# Patient Record
Sex: Male | Born: 1965 | Race: White | Hispanic: No | Smoking: Never smoker
Health system: Southern US, Community
[De-identification: ages and names within clinical notes are randomized; demographics above are authoritative.]

## PROBLEM LIST (undated history)

## (undated) DIAGNOSIS — K219 Gastro-esophageal reflux disease without esophagitis: Secondary | ICD-10-CM

## (undated) DIAGNOSIS — F419 Anxiety disorder, unspecified: Secondary | ICD-10-CM

## (undated) DIAGNOSIS — I10 Essential (primary) hypertension: Secondary | ICD-10-CM

---

## 2005-08-28 ENCOUNTER — Other Ambulatory Visit: Payer: Self-pay

## 2005-08-28 ENCOUNTER — Emergency Department: Payer: Self-pay | Admitting: Emergency Medicine

## 2005-09-08 ENCOUNTER — Ambulatory Visit: Payer: Self-pay | Admitting: Gastroenterology

## 2008-08-22 ENCOUNTER — Ambulatory Visit: Payer: Self-pay | Admitting: Family Medicine

## 2008-08-22 ENCOUNTER — Emergency Department: Payer: Self-pay | Admitting: Surgery

## 2009-06-21 ENCOUNTER — Emergency Department: Payer: Self-pay | Admitting: Internal Medicine

## 2010-05-17 ENCOUNTER — Emergency Department: Payer: Self-pay | Admitting: Internal Medicine

## 2010-07-07 ENCOUNTER — Ambulatory Visit: Payer: Self-pay | Admitting: Gastroenterology

## 2010-07-09 LAB — PATHOLOGY REPORT

## 2010-09-25 ENCOUNTER — Ambulatory Visit: Payer: Self-pay | Admitting: Gastroenterology

## 2010-09-28 ENCOUNTER — Emergency Department: Payer: Self-pay

## 2011-01-08 ENCOUNTER — Emergency Department: Payer: Self-pay | Admitting: Emergency Medicine

## 2011-03-26 ENCOUNTER — Emergency Department: Payer: Self-pay | Admitting: *Deleted

## 2011-04-15 ENCOUNTER — Ambulatory Visit: Payer: Self-pay | Admitting: Pain Medicine

## 2011-04-23 ENCOUNTER — Emergency Department: Payer: Self-pay | Admitting: *Deleted

## 2011-05-05 ENCOUNTER — Ambulatory Visit: Payer: Self-pay | Admitting: Pain Medicine

## 2011-05-24 ENCOUNTER — Ambulatory Visit: Payer: Self-pay | Admitting: Pain Medicine

## 2011-05-27 ENCOUNTER — Ambulatory Visit: Payer: Self-pay | Admitting: Pain Medicine

## 2011-06-01 ENCOUNTER — Ambulatory Visit: Payer: Self-pay | Admitting: Pain Medicine

## 2011-06-07 ENCOUNTER — Ambulatory Visit: Payer: Self-pay | Admitting: Pain Medicine

## 2011-06-17 ENCOUNTER — Ambulatory Visit: Payer: Self-pay | Admitting: Pain Medicine

## 2011-09-18 ENCOUNTER — Ambulatory Visit: Payer: Self-pay | Admitting: Medical

## 2011-09-18 LAB — COMPREHENSIVE METABOLIC PANEL
Bilirubin,Total: 2 mg/dL — ABNORMAL HIGH (ref 0.2–1.0)
Calcium, Total: 9.8 mg/dL (ref 8.5–10.1)
Creatinine: 1.24 mg/dL (ref 0.60–1.30)
EGFR (African American): >60
Glucose: 105 mg/dL — ABNORMAL HIGH (ref 65–99)
Osmolality: 277 (ref 275–301)
SGOT(AST): 28 U/L (ref 15–37)
SGPT (ALT): 40 U/L

## 2011-09-18 LAB — CK: CK, Total: 221 U/L (ref 35–232)

## 2011-09-18 LAB — CBC WITH DIFFERENTIAL/PLATELET
Basophil #: 0 10*3/uL (ref 0.0–0.1)
Eosinophil #: 0.2 10*3/uL (ref 0.0–0.7)
HCT: 44.4 % (ref 40.0–52.0)
Lymphocyte #: 1.7 10*3/uL (ref 1.0–3.6)
MCV: 95 fL (ref 80–100)
Neutrophil #: 4.5 10*3/uL (ref 1.4–6.5)
RDW: 12.6 % (ref 11.5–14.5)
WBC: 7 10*3/uL (ref 3.8–10.6)

## 2011-10-18 ENCOUNTER — Ambulatory Visit: Payer: Self-pay

## 2011-11-08 ENCOUNTER — Emergency Department: Payer: Self-pay | Admitting: Emergency Medicine

## 2011-11-08 LAB — URINALYSIS, COMPLETE
Bacteria: NONE SEEN
Blood: NEGATIVE
Glucose,UR: NEGATIVE mg/dL (ref 0–75)
Leukocyte Esterase: NEGATIVE
Nitrite: NEGATIVE
Ph: 6 (ref 4.5–8.0)
Protein: NEGATIVE
RBC,UR: <1 /HPF (ref 0–5)
Specific Gravity: 1.015 (ref 1.003–1.030)

## 2011-11-08 LAB — CBC
HCT: 42.4 % (ref 40.0–52.0)
MCH: 32.3 pg (ref 26.0–34.0)
MCHC: 34.6 g/dL (ref 32.0–36.0)
RBC: 4.53 10*6/uL (ref 4.40–5.90)
RDW: 12.4 % (ref 11.5–14.5)
WBC: 8.4 10*3/uL (ref 3.8–10.6)

## 2011-11-08 LAB — COMPREHENSIVE METABOLIC PANEL
Anion Gap: 8 (ref 7–16)
BUN: 16 mg/dL (ref 7–18)
Calcium, Total: 9.4 mg/dL (ref 8.5–10.1)
Chloride: 101 mmol/L (ref 98–107)
EGFR (African American): >60
EGFR (Non-African Amer.): >60
Osmolality: 278 (ref 275–301)
Potassium: 3.7 mmol/L (ref 3.5–5.1)
SGOT(AST): 27 U/L (ref 15–37)
SGPT (ALT): 32 U/L
Sodium: 138 mmol/L (ref 136–145)
Total Protein: 7.8 g/dL (ref 6.4–8.2)

## 2011-11-16 ENCOUNTER — Emergency Department: Payer: Self-pay | Admitting: Emergency Medicine

## 2011-11-24 ENCOUNTER — Ambulatory Visit: Payer: Self-pay | Admitting: Gastroenterology

## 2011-11-29 ENCOUNTER — Ambulatory Visit: Payer: Self-pay | Admitting: Gastroenterology

## 2011-12-02 ENCOUNTER — Ambulatory Visit: Payer: Self-pay | Admitting: Medical

## 2011-12-06 ENCOUNTER — Emergency Department: Payer: Self-pay | Admitting: Emergency Medicine

## 2011-12-06 LAB — CBC
HCT: 42.8 %
HGB: 15.3 g/dL
MCH: 33 pg
MCHC: 35.7 g/dL
MCV: 92 fL
Platelet: 350 x10 3/mm 3
RBC: 4.63 x10 6/mm 3
RDW: 12.5 %
WBC: 8.2 x10 3/mm 3

## 2011-12-06 LAB — URINALYSIS, COMPLETE
Blood: NEGATIVE
Glucose,UR: NEGATIVE mg/dL (ref 0–75)
Ketone: NEGATIVE
Leukocyte Esterase: NEGATIVE
Nitrite: NEGATIVE
Ph: 7 (ref 4.5–8.0)
Protein: NEGATIVE
RBC,UR: NONE SEEN /HPF (ref 0–5)
Specific Gravity: 1.006 (ref 1.003–1.030)
Squamous Epithelial: NONE SEEN
WBC UR: <1 /HPF (ref 0–5)

## 2011-12-06 LAB — COMPREHENSIVE METABOLIC PANEL WITH GFR
Albumin: 4.3 g/dL
Alkaline Phosphatase: 77 U/L
Anion Gap: 8
BUN: 16 mg/dL
Bilirubin,Total: 1.1 mg/dL — ABNORMAL HIGH
Calcium, Total: 9.7 mg/dL
Chloride: 100 mmol/L
Co2: 29 mmol/L
Creatinine: 1.24 mg/dL
EGFR (African American): >60
EGFR (Non-African Amer.): >60
Glucose: 97 mg/dL
Osmolality: 275
Potassium: 3.8 mmol/L
SGOT(AST): 21 U/L
SGPT (ALT): 25 U/L
Sodium: 137 mmol/L
Total Protein: 8.1 g/dL

## 2011-12-06 LAB — LIPASE, BLOOD: Lipase: 247 U/L

## 2012-01-04 ENCOUNTER — Ambulatory Visit: Payer: Self-pay | Admitting: Gastroenterology

## 2012-01-10 LAB — PATHOLOGY REPORT

## 2012-03-29 ENCOUNTER — Other Ambulatory Visit: Payer: Self-pay | Admitting: Gastroenterology

## 2012-03-29 LAB — CLOSTRIDIUM DIFFICILE BY PCR

## 2012-04-01 LAB — STOOL CULTURE

## 2012-05-03 ENCOUNTER — Ambulatory Visit: Payer: Self-pay | Admitting: Gastroenterology

## 2012-05-08 ENCOUNTER — Ambulatory Visit: Payer: Self-pay | Admitting: Gastroenterology

## 2012-05-08 LAB — TSH: Thyroid Stimulating Horm: 1.03 u[IU]/mL

## 2012-05-10 ENCOUNTER — Ambulatory Visit: Payer: Self-pay | Admitting: Family Medicine

## 2012-11-19 ENCOUNTER — Ambulatory Visit: Payer: Self-pay | Admitting: Internal Medicine

## 2012-11-19 LAB — CBC WITH DIFFERENTIAL/PLATELET
Basophil #: 0 10*3/uL (ref 0.0–0.1)
Basophil %: 0.5 %
Eosinophil #: 0.4 10*3/uL (ref 0.0–0.7)
Lymphocyte #: 2 10*3/uL (ref 1.0–3.6)
MCH: 33.1 pg (ref 26.0–34.0)
Monocyte #: 0.8 x10 3/mm (ref 0.2–1.0)
Monocyte %: 9.6 %
Neutrophil #: 4.9 10*3/uL (ref 1.4–6.5)
RBC: 4.89 10*6/uL (ref 4.40–5.90)
RDW: 13.4 % (ref 11.5–14.5)

## 2012-11-19 LAB — CK: CK, Total: 459 U/L — ABNORMAL HIGH (ref 35–232)

## 2012-11-19 LAB — COMPREHENSIVE METABOLIC PANEL
Albumin: 3.8 g/dL (ref 3.4–5.0)
Alkaline Phosphatase: 81 U/L (ref 50–136)
BUN: 14 mg/dL (ref 7–18)
Bilirubin,Total: 1 mg/dL (ref 0.2–1.0)
Calcium, Total: 9.1 mg/dL (ref 8.5–10.1)
Creatinine: 1.33 mg/dL — ABNORMAL HIGH (ref 0.60–1.30)
EGFR (African American): >60
EGFR (Non-African Amer.): >60
Glucose: 110 mg/dL — ABNORMAL HIGH (ref 65–99)
Sodium: 140 mmol/L (ref 136–145)
Total Protein: 7.9 g/dL (ref 6.4–8.2)

## 2012-11-19 LAB — TROPONIN I: Troponin-I: <0.02 ng/mL

## 2012-11-19 LAB — CK-MB: CK-MB: 2.1 ng/mL (ref 0.5–3.6)

## 2013-04-21 ENCOUNTER — Ambulatory Visit: Payer: Self-pay | Admitting: Family Medicine

## 2013-04-21 ENCOUNTER — Ambulatory Visit: Payer: Self-pay | Admitting: Physician Assistant

## 2013-05-13 ENCOUNTER — Ambulatory Visit: Payer: Self-pay | Admitting: Family Medicine

## 2013-07-10 ENCOUNTER — Ambulatory Visit: Payer: Self-pay | Admitting: Gastroenterology

## 2013-07-12 LAB — PATHOLOGY REPORT

## 2013-08-02 IMAGING — CR DG THORACIC SPINE 2-3V
1 series · 5 of 5 positions shown · non-contrast
Comparison: none

REASON FOR EXAM: xyphoid/chest pain/ non cardiac sterunum and
COMMENTS:

[Series 1: t thoracic spine ap · 0.14mm/px · 5 of 5 slices shown]
[im 1/5]
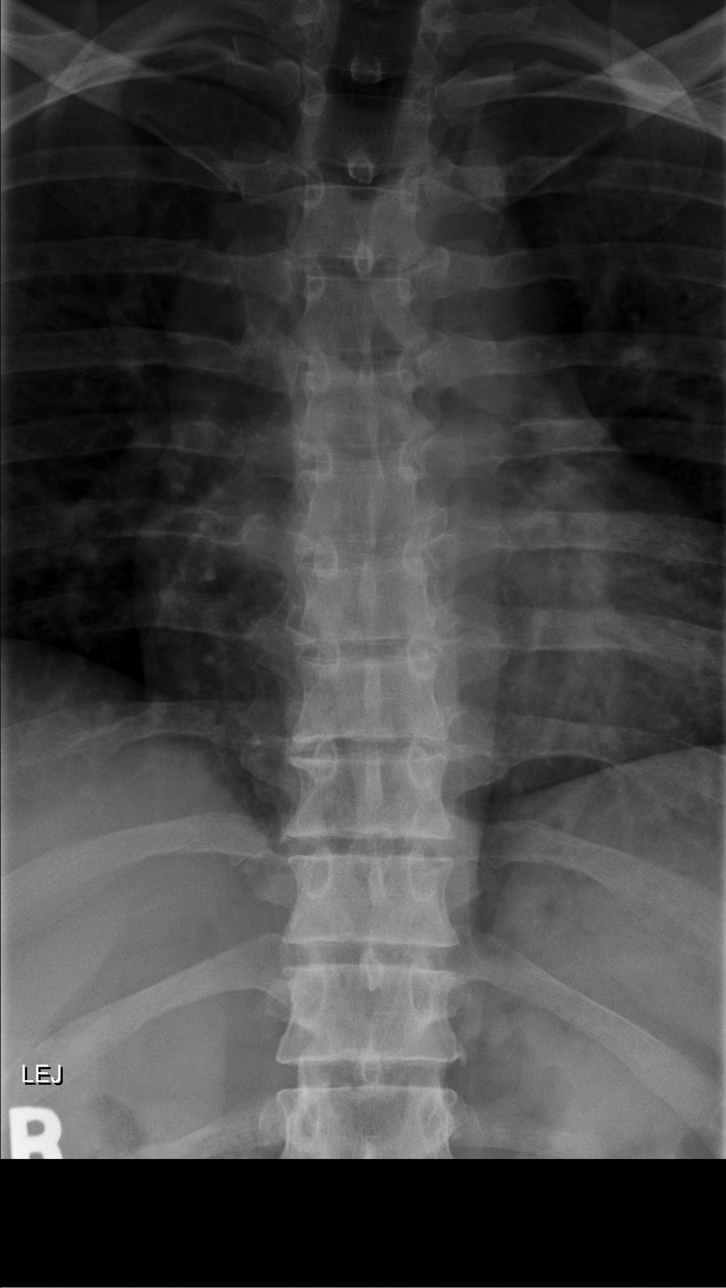
[im 2/5]
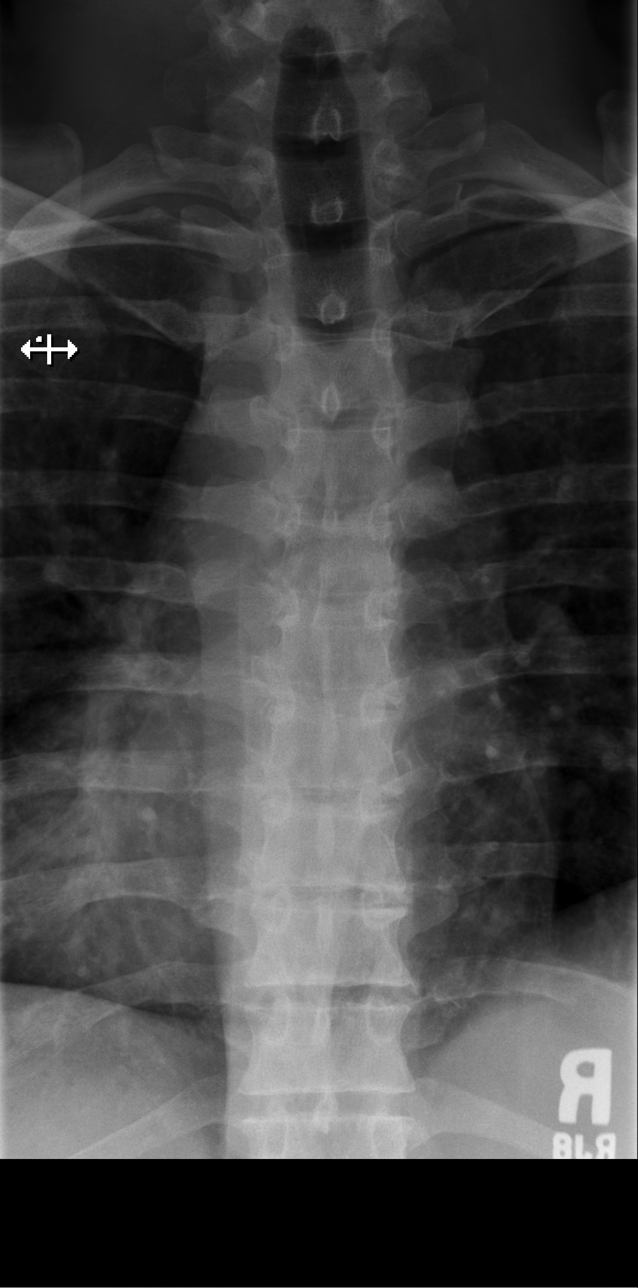
[im 3/5]
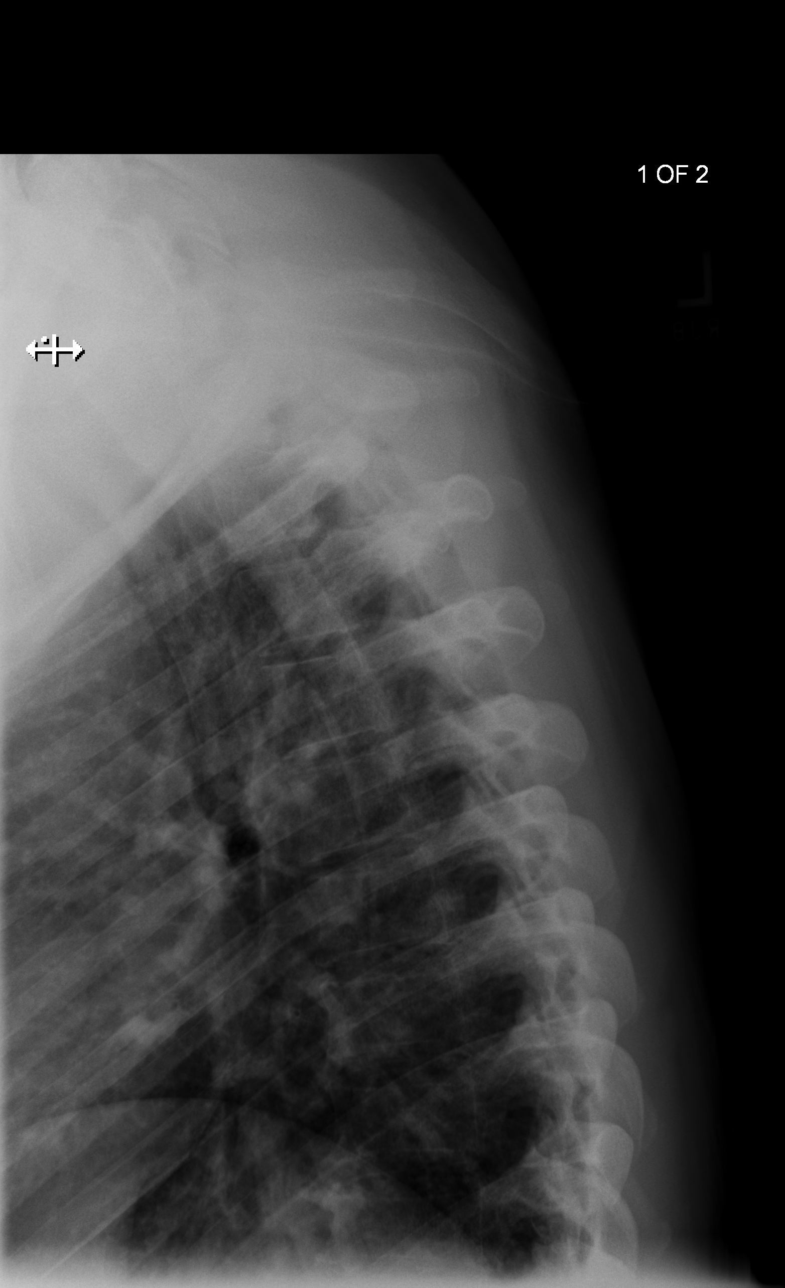
[im 4/5]
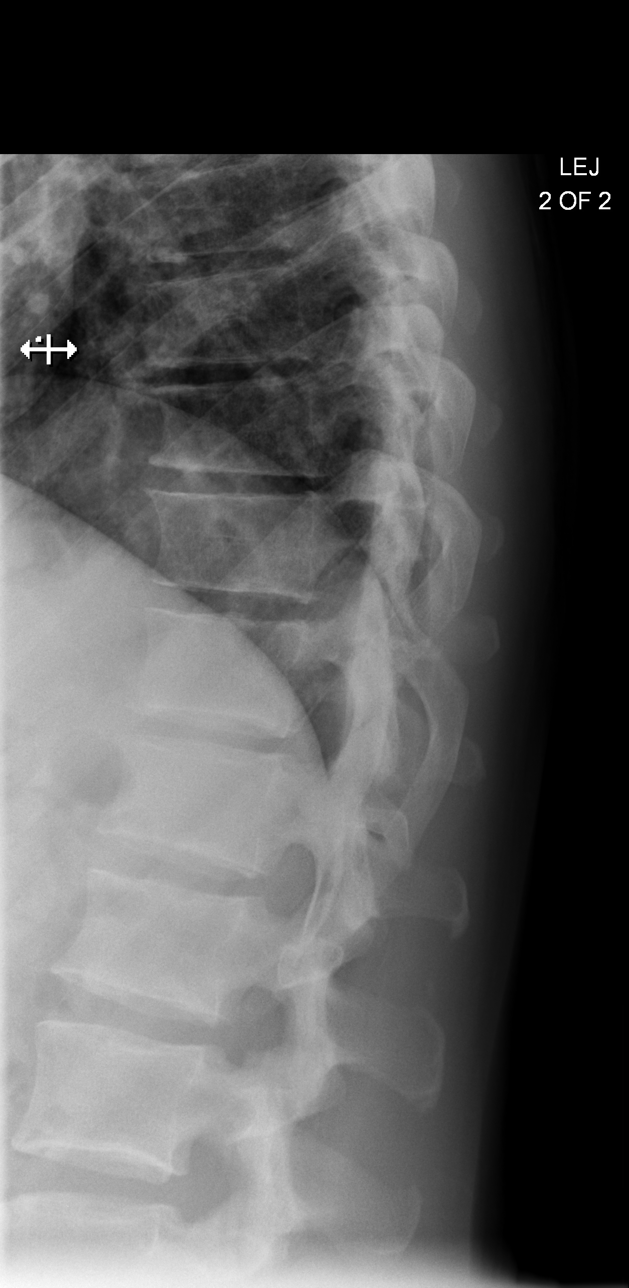
[im 5/5]
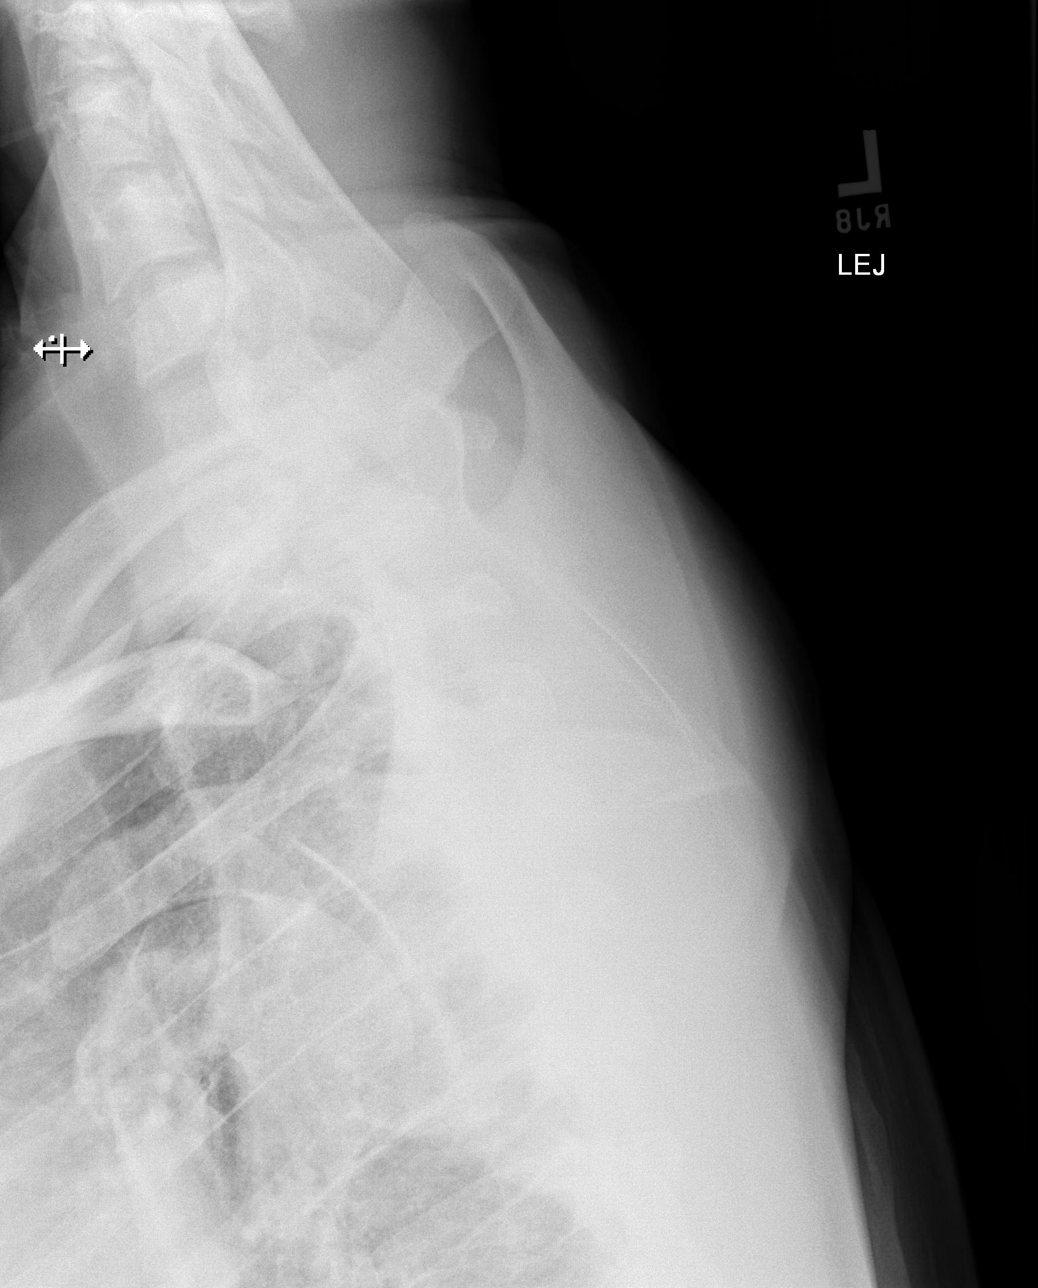

[5 of 5 positions shown; findings below may reference images not displayed]

PROCEDURE:     DXR - DXR THORACIC  AP AND LATERAL  - May 24, 2011 [DATE]

RESULT:     AP and lateral projections of the thoracic spine including an
attempted swimmer's view show grossly normal spinal alignment with
preservation of the vertebral body heights and intervertebral disc spaces.
Minimal degenerative disc disease changes and disc space narrowing are noted.
IMPRESSION: No acute bony abnormality evident. MRI is available for further assessment
if there is concern for an acute compression fracture.

## 2013-08-12 ENCOUNTER — Ambulatory Visit: Payer: Self-pay | Admitting: Emergency Medicine

## 2013-11-21 ENCOUNTER — Ambulatory Visit: Payer: Self-pay

## 2013-11-21 LAB — CBC WITH DIFFERENTIAL/PLATELET
Basophil #: 0.1 10*3/uL (ref 0.0–0.1)
Basophil %: 0.6 %
Eosinophil #: 0.3 10*3/uL (ref 0.0–0.7)
Eosinophil %: 3.1 %
HCT: 42.8 % (ref 40.0–52.0)
HGB: 14.9 g/dL (ref 13.0–18.0)
LYMPHS ABS: 2.5 10*3/uL (ref 1.0–3.6)
Lymphocyte %: 30.1 %
MCH: 34.2 pg — ABNORMAL HIGH (ref 26.0–34.0)
MCHC: 34.9 g/dL (ref 32.0–36.0)
MCV: 98 fL (ref 80–100)
MONOS PCT: 10.1 %
Monocyte #: 0.8 x10 3/mm (ref 0.2–1.0)
NEUTROS ABS: 4.7 10*3/uL (ref 1.4–6.5)
NEUTROS PCT: 56.1 %
Platelet: 345 10*3/uL (ref 150–440)
RBC: 4.36 10*6/uL — ABNORMAL LOW (ref 4.40–5.90)
RDW: 12.8 % (ref 11.5–14.5)
WBC: 8.4 10*3/uL (ref 3.8–10.6)

## 2013-11-21 LAB — COMPREHENSIVE METABOLIC PANEL
ALBUMIN: 3.9 g/dL (ref 3.4–5.0)
ALK PHOS: 87 U/L
Anion Gap: 9 (ref 7–16)
BUN: 16 mg/dL (ref 7–18)
Bilirubin,Total: 1 mg/dL (ref 0.2–1.0)
CALCIUM: 9.2 mg/dL (ref 8.5–10.1)
CHLORIDE: 100 mmol/L (ref 98–107)
CO2: 28 mmol/L (ref 21–32)
CREATININE: 1.13 mg/dL (ref 0.60–1.30)
EGFR (African American): >60
EGFR (Non-African Amer.): >60
Glucose: 117 mg/dL — ABNORMAL HIGH (ref 65–99)
OSMOLALITY: 276 (ref 275–301)
POTASSIUM: 4.1 mmol/L (ref 3.5–5.1)
SGOT(AST): 49 U/L — ABNORMAL HIGH (ref 15–37)
SGPT (ALT): 77 U/L — ABNORMAL HIGH
SODIUM: 137 mmol/L (ref 136–145)
Total Protein: 8.1 g/dL (ref 6.4–8.2)

## 2013-11-21 LAB — D-DIMER(ARMC): D-Dimer: 372 ng/ml

## 2013-11-21 LAB — CK TOTAL AND CKMB (NOT AT ARMC)
CK, Total: 618 U/L — ABNORMAL HIGH
CK-MB: 2.8 ng/mL (ref 0.5–3.6)

## 2013-12-27 IMAGING — CR DG RIBS 2V*L*
1 series · 3 of 3 positions shown · non-contrast
Comparison: none

REASON FOR EXAM: L lower rib pain
COMMENTS:

PROCEDURE:     MDR - MDR RIBS LEFT UNILATERAL  - October 18, 2011 [DATE]
RESULT:     Nondisplaced fracture of the posterior mid left rib cannot be
excluded. No pneumothorax. No other focal bony abnormalities identified.

[Series 1: ap · 0.17mm/px · 3 of 3 slices shown]
[im 1/3]
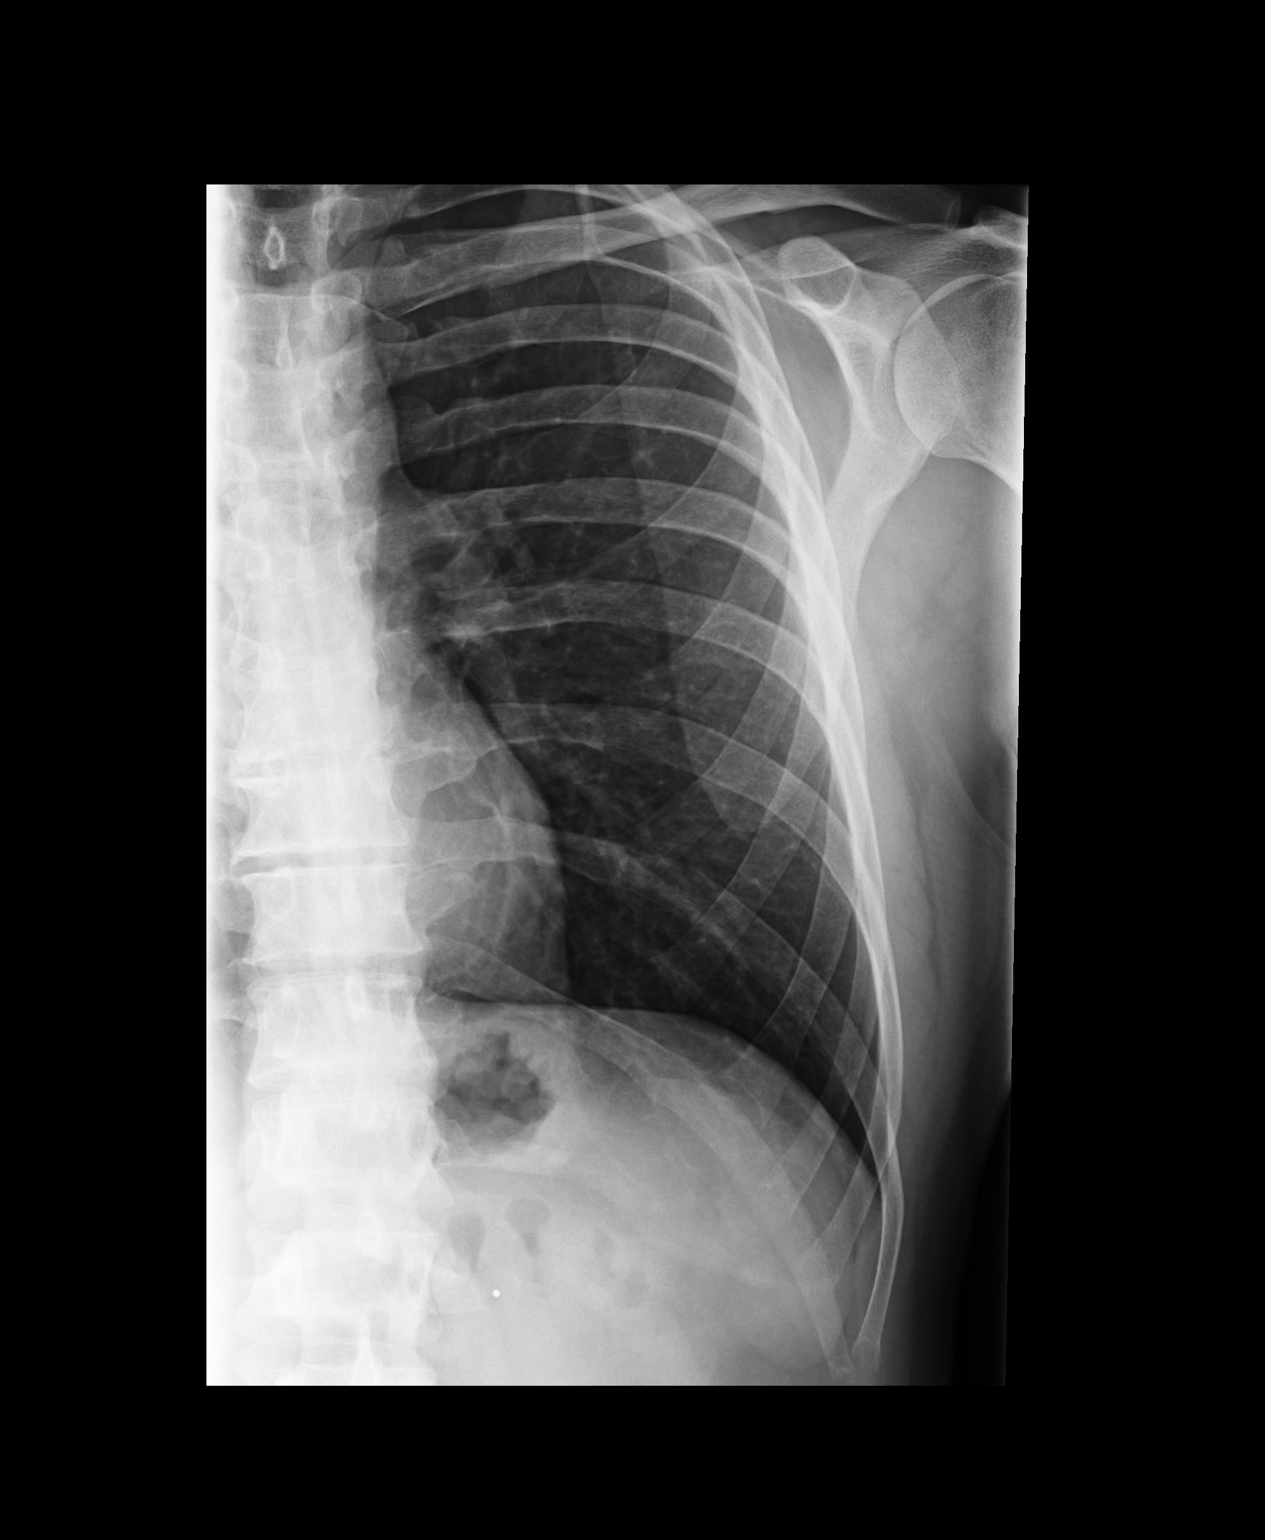
[im 2/3]
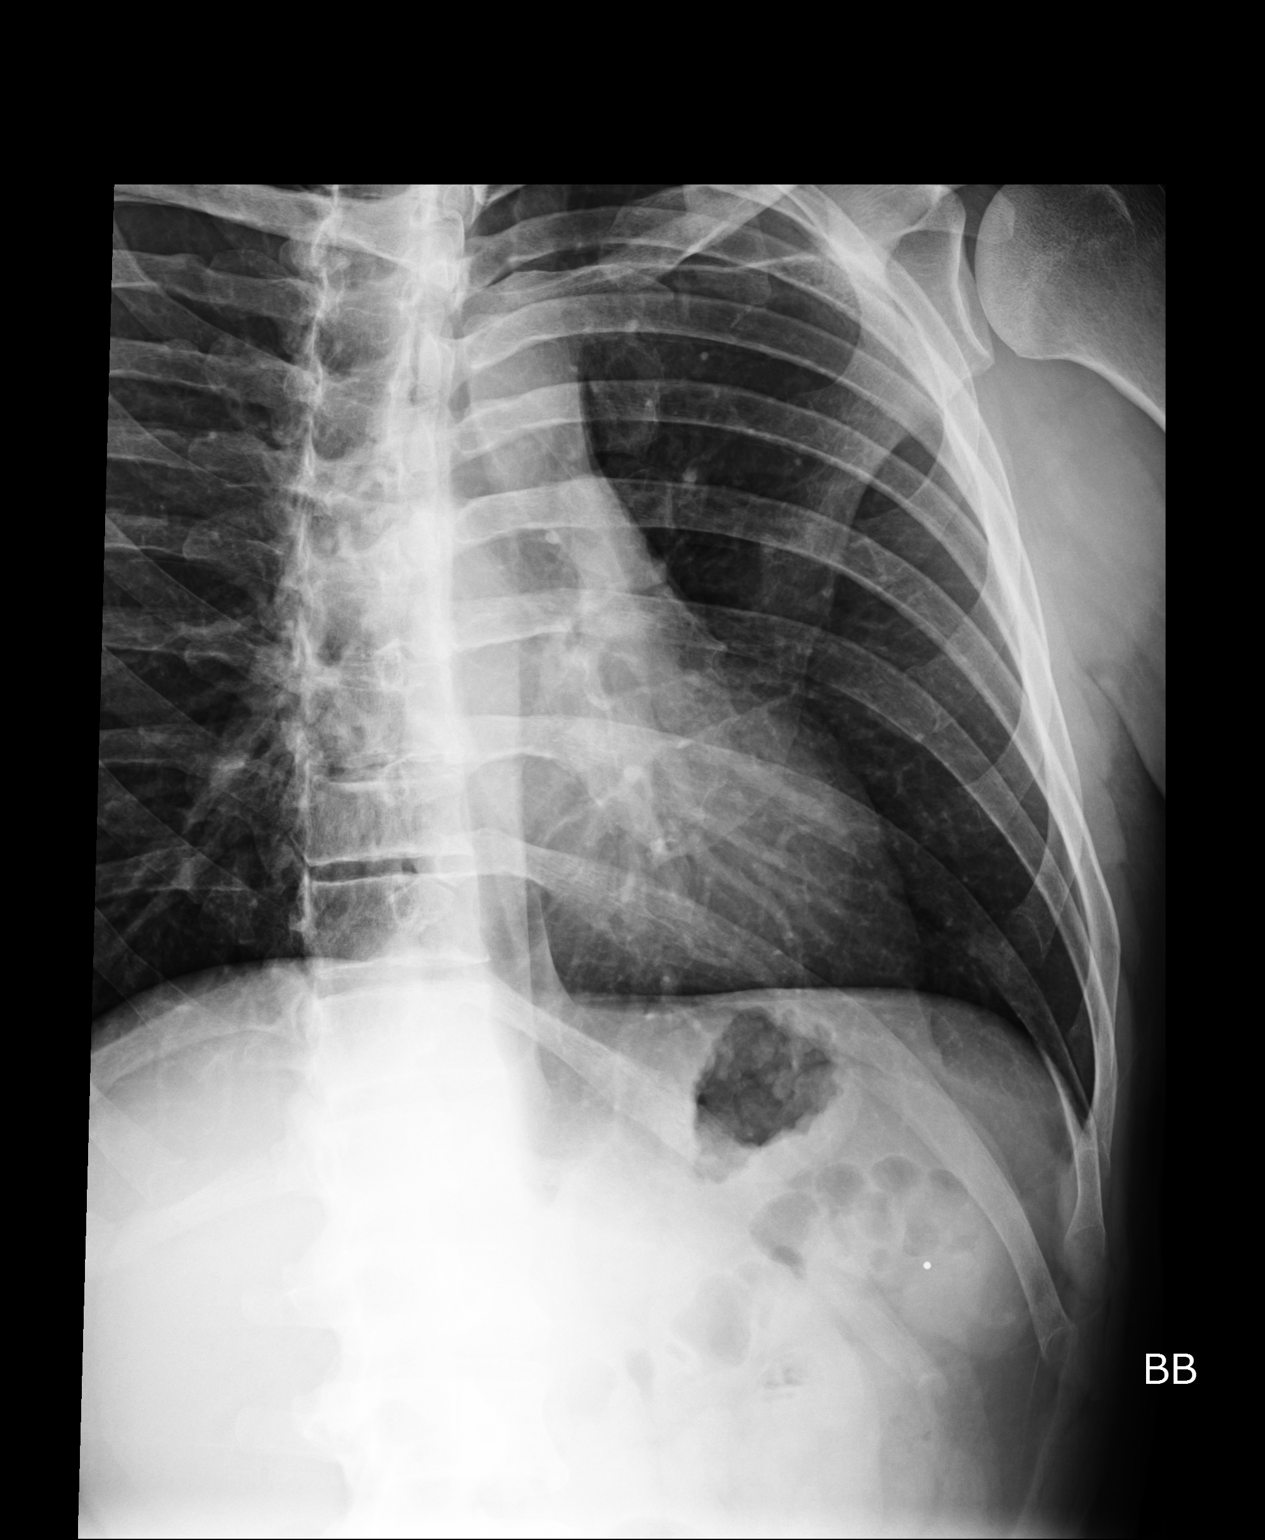
[im 3/3]
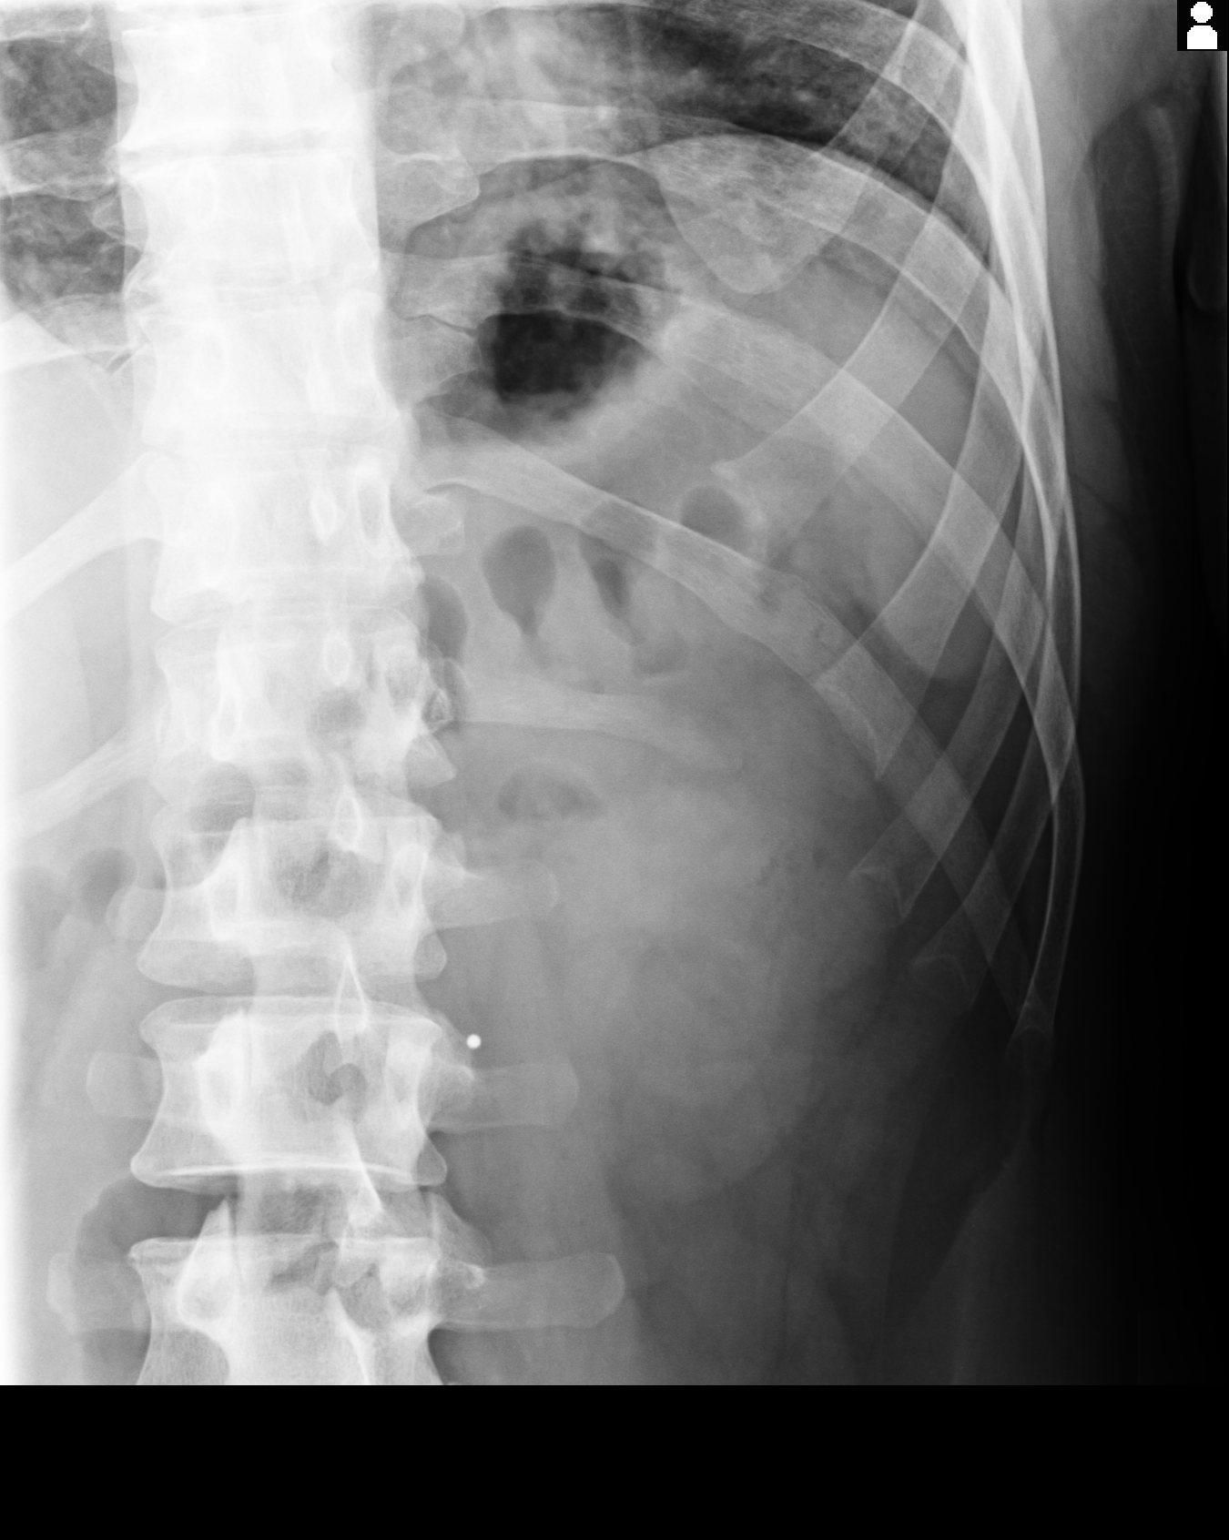

[3 of 3 positions shown; findings below may reference images not displayed]

IMPRESSION: Cannot exclude nondisplaced fracture of the midportion of
the posterior aspect of the left eleventh rib. No evidence of pneumothorax.

## 2013-12-27 IMAGING — CR DG CHEST 2V
1 series · 2 of 2 positions shown · non-contrast
Comparison: none

RESULT:      Lungs are clear. No pneumothorax.

Cardiovascular structures are unremarkable. Chest is stable from 09/18/2011.

[Series 1: pa · 0.17mm/px · 2 of 2 slices shown]
[im 1/2]
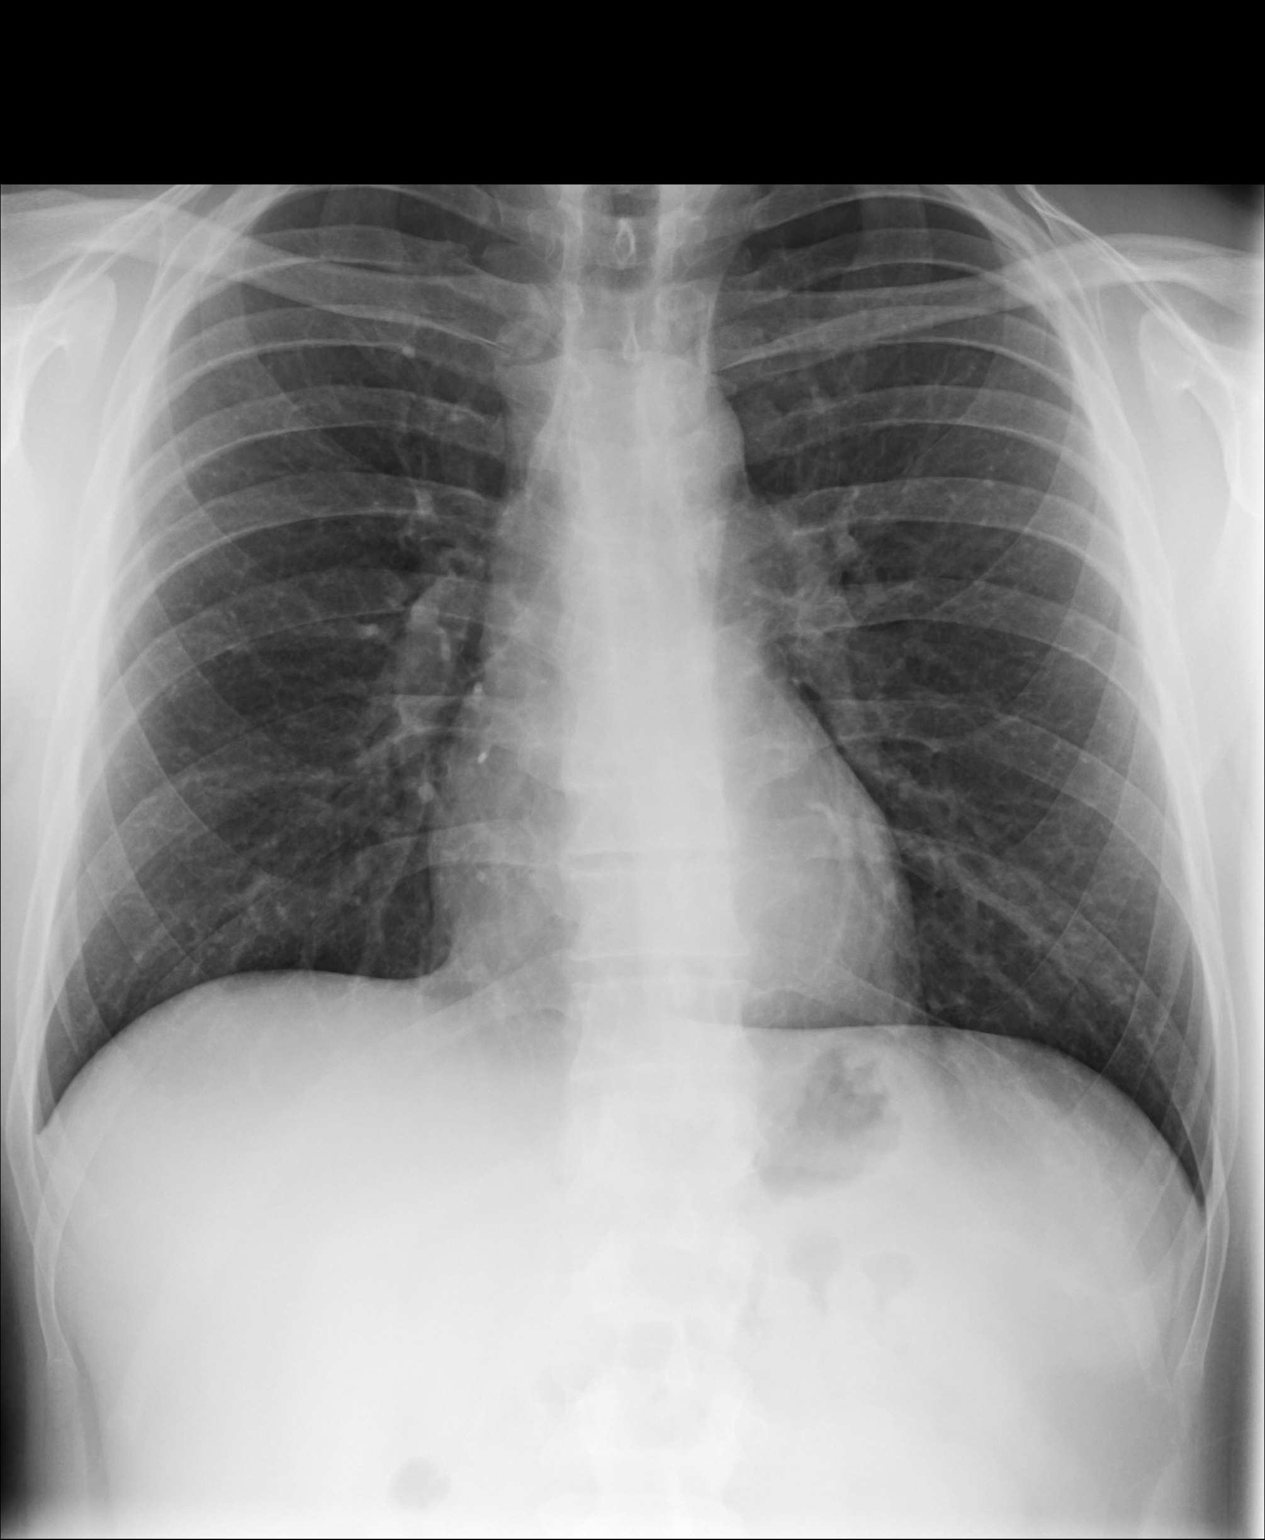
[im 2/2]
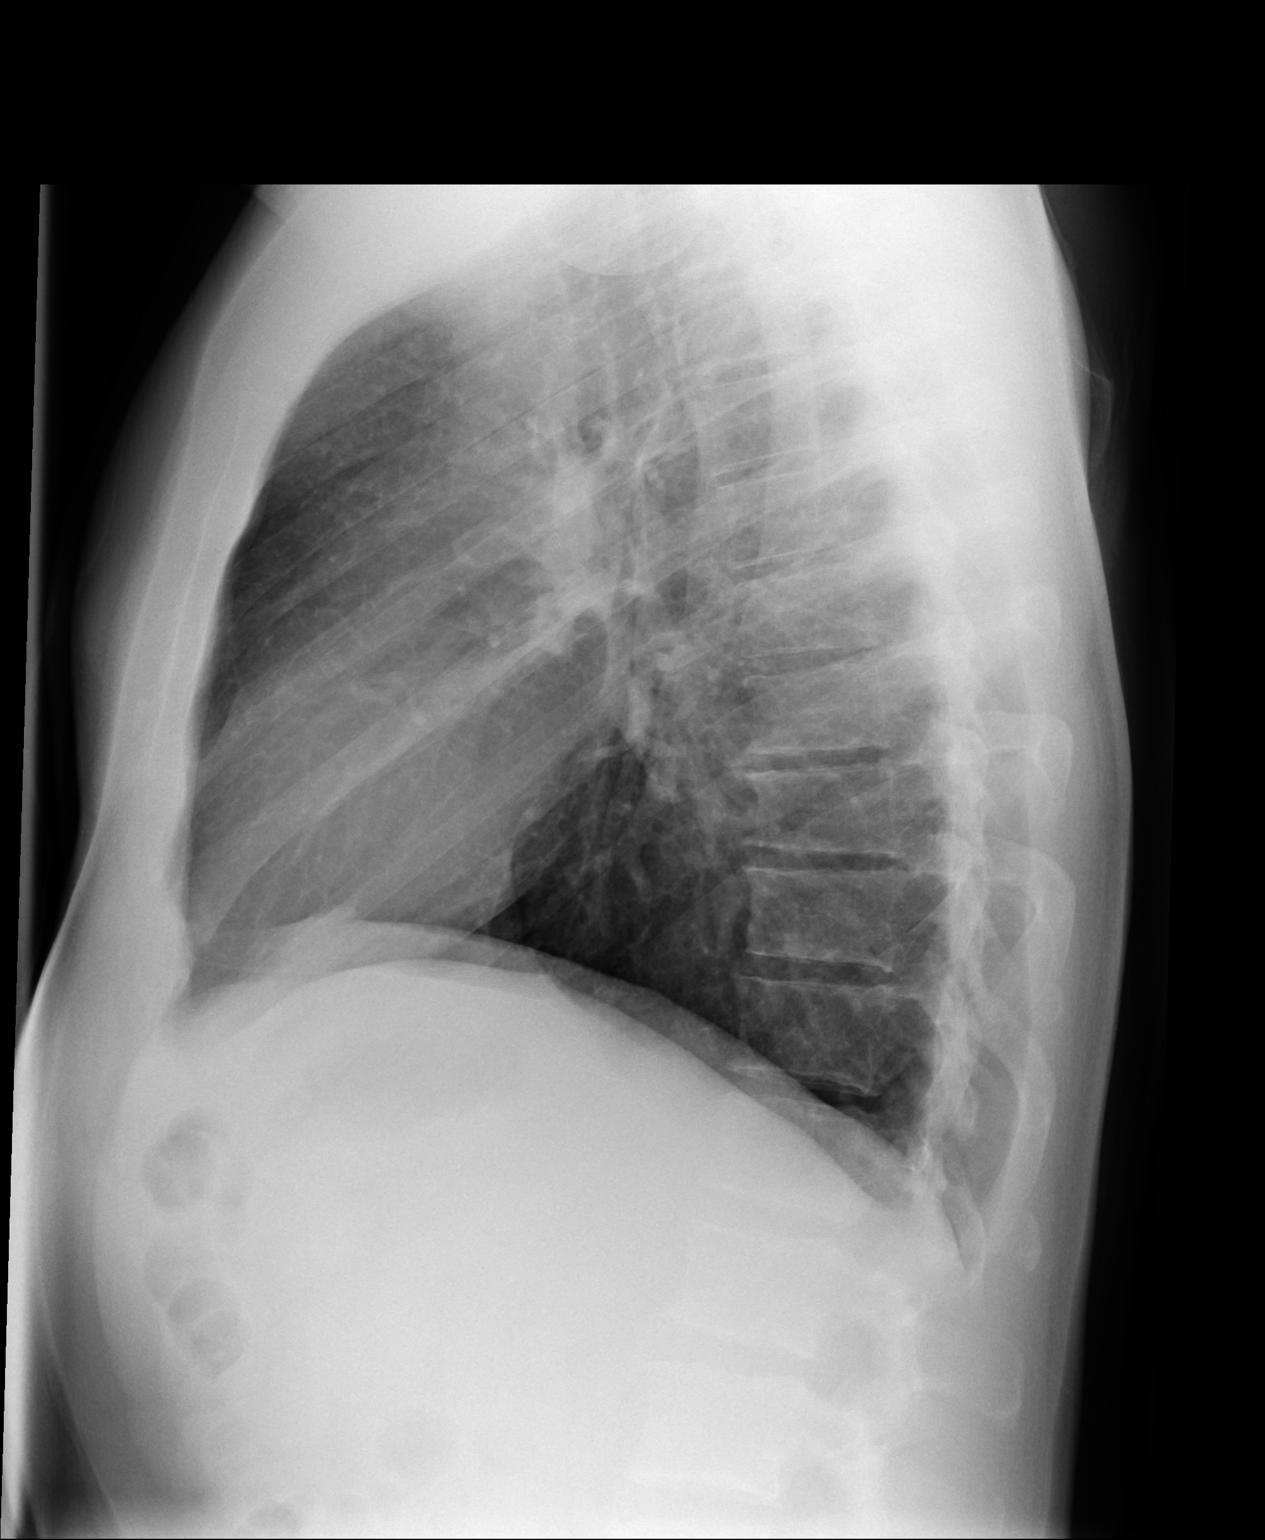

[2 of 2 positions shown; findings below may reference images not displayed]

IMPRESSION: No acute abnormality.

## 2014-02-23 ENCOUNTER — Emergency Department: Payer: Self-pay | Admitting: Emergency Medicine

## 2014-02-23 LAB — COMPREHENSIVE METABOLIC PANEL
Albumin: 4.2 g/dL (ref 3.4–5.0)
Alkaline Phosphatase: 81 U/L
Anion Gap: 8 (ref 7–16)
BILIRUBIN TOTAL: 1.1 mg/dL — AB (ref 0.2–1.0)
BUN: 16 mg/dL (ref 7–18)
Calcium, Total: 9 mg/dL (ref 8.5–10.1)
Chloride: 102 mmol/L (ref 98–107)
Co2: 28 mmol/L (ref 21–32)
Creatinine: 1.23 mg/dL (ref 0.60–1.30)
EGFR (Non-African Amer.): >60
Glucose: 110 mg/dL — ABNORMAL HIGH (ref 65–99)
OSMOLALITY: 278 (ref 275–301)
Potassium: 3.2 mmol/L — ABNORMAL LOW (ref 3.5–5.1)
SGOT(AST): 29 U/L (ref 15–37)
SGPT (ALT): 49 U/L
Sodium: 138 mmol/L (ref 136–145)
Total Protein: 8.2 g/dL (ref 6.4–8.2)

## 2014-02-23 LAB — CBC
HCT: 45.1 % (ref 40.0–52.0)
HGB: 15.6 g/dL (ref 13.0–18.0)
MCH: 33.7 pg (ref 26.0–34.0)
MCHC: 34.7 g/dL (ref 32.0–36.0)
MCV: 97 fL (ref 80–100)
PLATELETS: 332 10*3/uL (ref 150–440)
RBC: 4.64 10*6/uL (ref 4.40–5.90)
RDW: 12.7 % (ref 11.5–14.5)
WBC: 8.1 10*3/uL (ref 3.8–10.6)

## 2014-02-23 LAB — TROPONIN I: Troponin-I: <0.02 ng/mL

## 2014-02-23 LAB — PROTIME-INR
INR: 0.9
Prothrombin Time: 12.1 secs (ref 11.5–14.7)

## 2014-02-23 LAB — PRO B NATRIURETIC PEPTIDE: B-Type Natriuretic Peptide: 22 pg/mL (ref 0–125)

## 2014-06-28 ENCOUNTER — Emergency Department: Payer: Self-pay | Admitting: Student

## 2014-07-23 ENCOUNTER — Ambulatory Visit: Payer: Self-pay | Admitting: Emergency Medicine

## 2014-07-27 ENCOUNTER — Ambulatory Visit
Admit: 2014-07-27 | Disposition: A | Discharge status: Home / Self Care | Payer: Self-pay | Attending: Internal Medicine | Admitting: Internal Medicine

## 2014-09-15 ENCOUNTER — Ambulatory Visit
Admission: EM | Admit: 2014-09-15 | Discharge: 2014-09-15 | Disposition: A | Discharge status: Home / Self Care | Payer: BLUE CROSS/BLUE SHIELD | Attending: Family Medicine | Admitting: Family Medicine

## 2014-09-15 ENCOUNTER — Encounter: Payer: Self-pay | Admitting: Gynecology

## 2014-09-15 DIAGNOSIS — L0291 Cutaneous abscess, unspecified: Secondary | ICD-10-CM

## 2014-09-15 HISTORY — DX: Essential (primary) hypertension: I10

## 2014-09-15 HISTORY — DX: Gastro-esophageal reflux disease without esophagitis: K21.9

## 2014-09-15 MED ORDER — SULFAMETHOXAZOLE-TRIMETHOPRIM 800-160 MG PO TABS: 1.0000 | ORAL_TABLET | Freq: Two times a day (BID) | ORAL | Status: AC

## 2014-09-15 MED ORDER — OXYCODONE-ACETAMINOPHEN 5-325 MG PO TABS: 1.0000 | ORAL_TABLET | Freq: Four times a day (QID) | ORAL | Status: DC | PRN

## 2014-09-15 NOTE — ED Notes (Addendum)
Patient stated cyst on lower back x couple months. Pt. Stated was seen by PCP x 6 days ago and was given Doxy 100 BID / per pt. Has 4 days left of the antibx. Pt. Stated cyst painful / redness  And swollen.

## 2014-09-15 NOTE — Discharge Instructions (Signed)
Keep wound clean and dry. Change dressing as needed. Seek medical attention if any acute problems. Should return to the office here tomorrow for recheck. Stop doxycycline and start Bactrim DS. Percocet for increased discomfort when necessary.

## 2014-09-15 NOTE — ED Provider Notes (Signed)
Patient presents with history of abscess on left lower back area. Patient was given doxycycline and told to use warm compresses by his primary care doctor. The area has not been lanced yet. He has had this cyst/abscess for several months. The area now is very painful and red. She denies any fever.  Incision and Drainage Procedure Note  Pre-operative Diagnosis: Left lower back cyst/abscess  Post-operative Diagnosis: Incision and drainage of cyst/abscess  Indications: Painful abscess  Anesthesia: Lidocaine 1% without epi  Procedure Details  The procedure, risks and complications have been discussed in detail (including, but not limited to  infection, bleeding) with the patient, and the patient has given verbal consent to the procedure.  The skin was sterilely prepped and draped over the affected area in the usual fashion. After adequate local anesthesia, I&D with a #11 blade was performed of area.  Purulent drainage expressed with foul smell. The patient was observed until stable.  Drains: packing placed and dressing placed   Condition: Tolerated procedure well   Complications: none.  Post procedure instructions given to patient. Patient is to keep the wound clean and dry. He is to change the dressing today multiple times if necessary. He is to return to the office tomorrow for recheck and packing/dressing change. We'll prescribe pack Bactrim DS twice daily for 10 days. Also will also give patient adequate pain medication to use when necessary.  Paulina Fusi, MD 09/15/14 [phone]

## 2014-09-16 ENCOUNTER — Ambulatory Visit
Admission: EM | Admit: 2014-09-16 | Discharge: 2014-09-16 | Disposition: A | Discharge status: Home / Self Care | Payer: BLUE CROSS/BLUE SHIELD | Attending: Family Medicine | Admitting: Family Medicine

## 2014-09-16 ENCOUNTER — Encounter: Payer: Self-pay | Admitting: Emergency Medicine

## 2014-09-16 DIAGNOSIS — Z4800 Encounter for change or removal of nonsurgical wound dressing: Secondary | ICD-10-CM

## 2014-09-16 DIAGNOSIS — Z4801 Encounter for change or removal of surgical wound dressing: Secondary | ICD-10-CM

## 2014-09-16 DIAGNOSIS — L02212 Cutaneous abscess of back [any part, except buttock]: Secondary | ICD-10-CM

## 2014-09-16 NOTE — ED Notes (Signed)
Patient here for wound check and possible repacking of abscess to his back.  Patient was seen here yesterday. Patient reports low grade fever. Patient reports minimal drainage.

## 2014-09-16 NOTE — ED Provider Notes (Signed)
CSN: [id number]     Arrival date & time 09/16/14  0703 History   None    Chief Complaint  Patient presents with  . Wound Check  . Abscess   (Consider location/radiation/quality/duration/timing/severity/associated sxs/prior Treatment) HPI  Past Medical History  Diagnosis Date  . Hypertension   . GERD (gastroesophageal reflux disease)    History reviewed. No pertinent past surgical history. Family History  Problem Relation Age of Onset  . Cancer Mother   . Heart failure Father    History  Substance Use Topics  . Smoking status: Never Smoker   . Smokeless tobacco: Former Systems developer  . Alcohol Use: No    Review of Systems  Allergies  Review of patient's allergies indicates no known allergies.  Home Medications   Prior to Admission medications   Medication Sig Start Date End Date Taking? Authorizing Provider  doxycycline (VIBRAMYCIN) 100 MG capsule Take 100 mg by mouth 2 (two) times daily.    Historical Provider, MD  lisinopril-hydrochlorothiazide (PRINZIDE,ZESTORETIC) 20-25 MG per tablet Take 1 tablet by mouth daily.    Historical Provider, MD  omeprazole (PRILOSEC) 40 MG capsule Take 40 mg by mouth daily.    Historical Provider, MD  oxyCODONE-acetaminophen (PERCOCET/ROXICET) 5-325 MG per tablet Take 1 tablet by mouth every 6 (six) hours as needed for moderate pain or severe pain. 09/15/14   Paulina Fusi, MD  sulfamethoxazole-trimethoprim (BACTRIM DS,SEPTRA DS) 800-160 MG per tablet Take 1 tablet by mouth 2 (two) times daily. 09/15/14 09/25/14  Paulina Fusi, MD   BP 125/78 mmHg  Pulse 68  Temp(Src) 97.9 F (36.6 C) (Tympanic)  Resp 16  Ht 5\' 11"  (1.803 m)  Wt 205 lb (92.987 kg)  BMI 28.60 kg/m2  SpO2 96% Physical Exam  ED Course  Procedures (including critical care time) Labs Review Labs Reviewed - No data to display  Imaging Review No results found.   MDM   1. Abscess of lower back   2. Encounter for change of dressing        Frederich Cha, MD 09/17/14  [phone]

## 2014-09-16 NOTE — ED Provider Notes (Signed)
CSN: [id number]     Arrival date & time 09/16/14  0703 History   First MD Initiated Contact with Patient 09/16/14 [phone]     Chief Complaint  Patient presents with  . Wound Check  . Abscess   (Consider location/radiation/quality/duration/timing/severity/associated sxs/prior Treatment) HPI   Patient return for 1 day follow up on I&D of infected cyst lower back- wick in situ. Did well during night. Did not feel need to fill Pain Rx and has used Tylenol  Instead. Reinforced his dressing once but no additional bleeding/drainange noted.No fever Past Medical History  Diagnosis Date  . Hypertension   . GERD (gastroesophageal reflux disease)    History reviewed. No pertinent past surgical history. Family History  Problem Relation Age of Onset  . Cancer Mother   . Heart failure Father    History  Substance Use Topics  . Smoking status: Never Smoker   . Smokeless tobacco: Former Systems developer  . Alcohol Use: No    Review of Systems  All other systems reviewed and are negative.  Constitutional -afebrile Eyes-denies visual changes ENT- normal voice,denies sore throat CV-denies chest pain Resp-denies SOB GI- negative for nausea,vomiting, diarrhea GU- negative for dysuria MSK- negative for back pain, ambulatory Skin- denies acute changes Neuro- negative headache,focal weakness or numbness   Allergies  Review of patient's allergies indicates no known allergies.  Home Medications   Prior to Admission medications   Medication Sig Start Date End Date Taking? Authorizing Provider  doxycycline (VIBRAMYCIN) 100 MG capsule Take 100 mg by mouth 2 (two) times daily.    Historical Provider, MD  lisinopril-hydrochlorothiazide (PRINZIDE,ZESTORETIC) 20-25 MG per tablet Take 1 tablet by mouth daily.    Historical Provider, MD  omeprazole (PRILOSEC) 40 MG capsule Take 40 mg by mouth daily.    Historical Provider, MD  oxyCODONE-acetaminophen (PERCOCET/ROXICET) 5-325 MG per tablet Take 1 tablet by mouth  every 6 (six) hours as needed for moderate pain or severe pain. 09/15/14   Paulina Fusi, MD  sulfamethoxazole-trimethoprim (BACTRIM DS,SEPTRA DS) 800-160 MG per tablet Take 1 tablet by mouth 2 (two) times daily. 09/15/14 09/25/14  Paulina Fusi, MD   BP 125/78 mmHg  Pulse 68  Temp(Src) 97.9 F (36.6 C) (Tympanic)  Resp 16  Ht 5\' 11"  (1.803 m)  Wt 205 lb (92.987 kg)  BMI 28.60 kg/m2  SpO2 96% Physical Exam  Nursing note and vitals reviewed. Constitutional -alert and oriented,well appearing and in no acute distress Head-atraumatic Eyes- ,conjugate gaze Nose- no congestion or rhinorrhea Mouth/throat- mucous membranes moist , Neck- supple CV- regular rate, grossly normal heart sounds,  Resp-no distress, normal respiratory effort,clear to auscultation bilaterally MSK- no lower extremity tenderness, ambulatory Neuro- normal speech and language,  Skin-large intact dressing on lower back,mod amount of serosanguinous drainage noted on 5 inner 4x4's. Wick in situ,soaked, and removed with some tenderness noted by patient. No malodor noted.  Beefy erythema, patient reports tenderness as improved overall.  Psych-mood and affect grossly normal; speech and behavior grossly normal  ED Course  Procedures (including critical care time) Labs Review Labs Reviewed - No data to display  Imaging Review No results found.   Dressing change/Wick replaced:  1/2 " sterile wick replaced in wound- tolerated well by patient- and area re-dressed. May replace or reinforce dressing as desired should there be increased drainage over next 2 days.   MDM   1. Abscess of lower back   2. Encounter for change of dressing    Doing well- Afebrile - decreased tenderness-  new wick in situ Bactrim BID continues and RTC 2 days for wound check and probable wick D/C He has not needed narcotic Rx-using Tylenol ( hx GERD) with adequate relief  Continue dry dressing- no shower/bathing until re-evaluation    Jan Fireman, PA-C 09/16/14 [phone]

## 2014-09-16 NOTE — Discharge Instructions (Signed)
Please continue your antibiotics as ordered- may use tylenol or ibuprofen as long as there is no stomach upset- keep dressing dry- change if needed- there IS a wick present please be careful not to dislodge it. Return to see [place] in 2 days for wick advance and dressing change. .. Call or return with questions or concerns. Thank you for choosing [place] for your care today !  Discussed with patient -- He opted not to print.

## 2014-09-18 ENCOUNTER — Ambulatory Visit
Admission: EM | Admit: 2014-09-18 | Discharge: 2014-09-18 | Disposition: A | Discharge status: Home / Self Care | Payer: BLUE CROSS/BLUE SHIELD

## 2014-09-18 DIAGNOSIS — Z5189 Encounter for other specified aftercare: Secondary | ICD-10-CM

## 2014-09-18 DIAGNOSIS — Z4801 Encounter for change or removal of surgical wound dressing: Secondary | ICD-10-CM

## 2014-09-18 NOTE — ED Notes (Signed)
Packing removed left low back and repacked by Dr, Raliegh Ip. Posey Pronto

## 2014-09-18 NOTE — ED Provider Notes (Signed)
CSN: [id number]     Arrival date & time 09/18/14  0701 History   First MD Initiated Contact with Patient 09/16/14 [phone]     Chief Complaint  Patient presents with  . Wound Check   (Consider location/radiation/quality/duration/timing/severity/associated sxs/prior Treatment) Wound Check  Patient is a 49 y.o. male presenting with wound check.     Patient return for 1 day follow up on I&D of infected cyst lower back- wick in situ. Did well during night. Did not feel need to fill Pain Rx and has used Tylenol  Instead. Reinforced his dressing once but no additional bleeding/drainange noted.No fever noted by patient. Taking Bactrim DS. Past Medical History  Diagnosis Date  . Hypertension   . GERD (gastroesophageal reflux disease)    History reviewed. No pertinent past surgical history. Family History  Problem Relation Age of Onset  . Cancer Mother   . Heart failure Father    History  Substance Use Topics  . Smoking status: Never Smoker   . Smokeless tobacco: Former Systems developer  . Alcohol Use: No    Review of Systems  All other systems reviewed and are negative.  Constitutional -afebrile Eyes-denies visual changes ENT- normal voice,denies sore throat CV-denies chest pain Resp-denies SOB GI- negative for nausea,vomiting, diarrhea GU- negative for dysuria MSK- negative for back pain, ambulatory Skin- denies acute changes Neuro- negative headache,focal weakness or numbness   Allergies  Review of patient's allergies indicates no known allergies.  Home Medications   Prior to Admission medications   Medication Sig Start Date End Date Taking? Authorizing Provider  doxycycline (VIBRAMYCIN) 100 MG capsule Take 100 mg by mouth 2 (two) times daily.   Yes Historical Provider, MD  lisinopril-hydrochlorothiazide (PRINZIDE,ZESTORETIC) 20-25 MG per tablet Take 1 tablet by mouth daily.   Yes Historical Provider, MD  omeprazole (PRILOSEC) 40 MG capsule Take 40 mg by mouth daily.   Yes Historical  Provider, MD  oxyCODONE-acetaminophen (PERCOCET/ROXICET) 5-325 MG per tablet Take 1 tablet by mouth every 6 (six) hours as needed for moderate pain or severe pain. 09/15/14  Yes Paulina Fusi, MD  sulfamethoxazole-trimethoprim (BACTRIM DS,SEPTRA DS) 800-160 MG per tablet Take 1 tablet by mouth 2 (two) times daily. 09/15/14 09/25/14 Yes Paulina Fusi, MD   Pulse 64  Temp(Src) 99 F (37.2 C) (Oral)  Resp 16  Ht 5\' 11"  (1.803 m)  Wt 203 lb (92.08 kg)  BMI 28.33 kg/m2  SpO2 99% Physical Exam  Nursing note and vitals reviewed. Constitutional -alert and oriented,well appearing and in no acute distress Skin-intact packing and dressing on lower back,mod amount of serosanguinous drainage noted on 5 inner 4x4's. Some tenderness noted by patient. No discharged expressed once packing removed   Patient reports tenderness as improved overall.  ED Course  Procedures  Labs Review Labs Reviewed - No data to display  Imaging Review No results found.   Dressing change/Packing replaced:  1/2 " sterile wick replaced with 1/4" in wound- tolerated well by patient- and area re-dressed. May replace or reinforce dressing as desired.   MDM  Temp. 99 today. Recommend monitoring today and f/u tomorrow.  Bactrim BID continues and RTC tomorrow for wound check. He has not needed narcotic Rx-using Tylenol ( hx GERD) with adequate relief  Continue dry dressing- no shower/bathing until re-evaluation     Paulina Fusi, MD 09/18/14 [phone]

## 2014-09-18 NOTE — ED Notes (Signed)
Seen here at [facility] Sunday morning and had an I&D of abscess left lower back. Here for wound check

## 2014-09-18 NOTE — Discharge Instructions (Signed)
Keep area clean and dry. Continue antibiotic. Monitor for fever. F/U here tomorrow, sooner if problems.

## 2014-09-19 ENCOUNTER — Ambulatory Visit
Admission: EM | Admit: 2014-09-19 | Discharge: 2014-09-19 | Disposition: A | Discharge status: Home / Self Care | Payer: BLUE CROSS/BLUE SHIELD | Attending: Family Medicine | Admitting: Family Medicine

## 2014-09-19 ENCOUNTER — Encounter: Payer: Self-pay | Admitting: Emergency Medicine

## 2014-09-19 DIAGNOSIS — Z4801 Encounter for change or removal of surgical wound dressing: Secondary | ICD-10-CM

## 2014-09-19 DIAGNOSIS — Z5189 Encounter for other specified aftercare: Secondary | ICD-10-CM

## 2014-09-19 DIAGNOSIS — L02212 Cutaneous abscess of back [any part, except buttock]: Secondary | ICD-10-CM

## 2014-09-19 NOTE — ED Notes (Signed)
Pt reports here for recheck of abscess right side of back. Pt was here yesterday, denies fever

## 2014-09-19 NOTE — ED Provider Notes (Signed)
CSN: [id number]     Arrival date & time 09/19/14  0707 History   First MD Initiated Contact with Patient 09/19/14 [phone]     Chief Complaint  Patient presents with  . Wound Check   (Consider location/radiation/quality/duration/timing/severity/associated sxs/prior Treatment) HPI     88m presents for follow up on left low back abscess, I&D done 5 days ago, healing well.  Here for repacking.  No systemic sxs.  Pain decreasing.  Minimal drainage.      Past Medical History  Diagnosis Date  . Hypertension   . GERD (gastroesophageal reflux disease)    History reviewed. No pertinent past surgical history. Family History  Problem Relation Age of Onset  . Cancer Mother   . Heart failure Father    History  Substance Use Topics  . Smoking status: Never Smoker   . Smokeless tobacco: Former Systems developer  . Alcohol Use: No    Review of Systems  Skin: Positive for wound.  All other systems reviewed and are negative.   Allergies  Review of patient's allergies indicates no known allergies.  Home Medications   Prior to Admission medications   Medication Sig Start Date End Date Taking? Authorizing Provider  doxycycline (VIBRAMYCIN) 100 MG capsule Take 100 mg by mouth 2 (two) times daily.    Historical Provider, MD  lisinopril-hydrochlorothiazide (PRINZIDE,ZESTORETIC) 20-25 MG per tablet Take 1 tablet by mouth daily.    Historical Provider, MD  omeprazole (PRILOSEC) 40 MG capsule Take 40 mg by mouth daily.    Historical Provider, MD  oxyCODONE-acetaminophen (PERCOCET/ROXICET) 5-325 MG per tablet Take 1 tablet by mouth every 6 (six) hours as needed for moderate pain or severe pain. 09/15/14   Paulina Fusi, MD  sulfamethoxazole-trimethoprim (BACTRIM DS,SEPTRA DS) 800-160 MG per tablet Take 1 tablet by mouth 2 (two) times daily. 09/15/14 09/25/14  Paulina Fusi, MD   BP 127/87 mmHg  Pulse 67  Temp(Src) 97 F (36.1 C) (Tympanic)  Ht 5\' 11"  (1.803 m)  Wt 205 lb (92.987 kg)  BMI 28.60 kg/m2  SpO2  98% Physical Exam  Constitutional: He is oriented to person, place, and time. He appears well-developed and well-nourished. No distress.  HENT:  Head: Normocephalic.  Pulmonary/Chest: Effort normal. No respiratory distress.  Musculoskeletal:       Back:  Neurological: He is alert and oriented to person, place, and time. Coordination normal.  Skin: Skin is warm and dry. No rash noted. He is not diaphoretic.  Psychiatric: He has a normal mood and affect. Judgment normal.  Nursing note and vitals reviewed.   ED Course  Procedures (including critical care time) Labs Review Labs Reviewed - No data to display  Imaging Review No results found.   MDM   1. Abscess of lower back   2. Visit for wound care    Repacked loosely, remove packing in 3 days.  Warm compresses TID after that.  Keep clean and dry.  F/u if any worsening.      Liam Graham, PA-C 09/19/14 [phone]

## 2014-09-19 NOTE — Discharge Instructions (Signed)

## 2014-12-04 ENCOUNTER — Ambulatory Visit
Admission: EM | Admit: 2014-12-04 | Discharge: 2014-12-04 | Disposition: A | Discharge status: Home / Self Care | Payer: BLUE CROSS/BLUE SHIELD | Attending: Family Medicine | Admitting: Family Medicine

## 2014-12-04 DIAGNOSIS — K219 Gastro-esophageal reflux disease without esophagitis: Secondary | ICD-10-CM | POA: Diagnosis not present

## 2014-12-04 DIAGNOSIS — F419 Anxiety disorder, unspecified: Secondary | ICD-10-CM | POA: Diagnosis not present

## 2014-12-04 DIAGNOSIS — J029 Acute pharyngitis, unspecified: Secondary | ICD-10-CM | POA: Diagnosis not present

## 2014-12-04 DIAGNOSIS — I10 Essential (primary) hypertension: Secondary | ICD-10-CM | POA: Diagnosis not present

## 2014-12-04 HISTORY — DX: Anxiety disorder, unspecified: F41.9

## 2014-12-04 LAB — RAPID STREP SCREEN (MED CTR MEBANE ONLY): STREPTOCOCCUS, GROUP A SCREEN (DIRECT): NEGATIVE

## 2014-12-04 NOTE — ED Notes (Signed)
Pt states "I woke up with this sore throat. It is painful."

## 2014-12-04 NOTE — ED Provider Notes (Signed)
CSN: [id number]     Arrival date & time 12/04/14  2094 History   First MD Initiated Contact with Patient 12/04/14 [phone]     Chief Complaint  Patient presents with  . Sore Throat   (Consider location/radiation/quality/duration/timing/severity/associated sxs/prior Treatment) HPI   This a 49 year old male who awoke this morning about 5 AM with a sore throat on the left side. He states that he has had no fever or chills. He also remembers that he was awakened about midnight when he took his 21 month son who is had a cold back to his son's mother's house and stated that time he was fine. He looked into his throat this morning with a flashlight and noticed that his left tonsil was enlarged and covered with "white stuff". He states that his son has had cold symptoms for the previous week and he does attend daycare.  Past Medical History  Diagnosis Date  . Hypertension   . GERD (gastroesophageal reflux disease)   . Anxiety    History reviewed. No pertinent past surgical history. Family History  Problem Relation Age of Onset  . Cancer Mother   . Heart failure Father    Social History  Substance Use Topics  . Smoking status: Never Smoker   . Smokeless tobacco: Former Systems developer  . Alcohol Use: No    Review of Systems  Constitutional: Positive for fatigue. Negative for fever and chills.  All other systems reviewed and are negative.   Allergies  Review of patient's allergies indicates no known allergies.  Home Medications   Prior to Admission medications   Medication Sig Start Date End Date Taking? Authorizing Provider  doxycycline (VIBRAMYCIN) 100 MG capsule Take 100 mg by mouth 2 (two) times daily.    Historical Provider, MD  lisinopril-hydrochlorothiazide (PRINZIDE,ZESTORETIC) 20-25 MG per tablet Take 1 tablet by mouth daily.    Historical Provider, MD  omeprazole (PRILOSEC) 40 MG capsule Take 40 mg by mouth daily.    Historical Provider, MD  oxyCODONE-acetaminophen (PERCOCET/ROXICET)  5-325 MG per tablet Take 1 tablet by mouth every 6 (six) hours as needed for moderate pain or severe pain. 09/15/14   Paulina Fusi, MD   Ht 5\' 11"  (1.803 m)  Wt 205 lb (92.987 kg)  BMI 28.60 kg/m2 Physical Exam  Constitutional: He is oriented to person, place, and time. He appears well-developed and well-nourished.  HENT:  Head: Normocephalic and atraumatic.  Right TM is dull. Oropharynx shows enlargement of the left tonsil but there is no identifiable exudate noticed. Patient does have increased clear sputum but does not completely clear with swallowing. There is no tenderness to percussion of the sinuses.  Eyes: Pupils are equal, round, and reactive to light.  Neck: Neck supple. No thyromegaly present.  Pulmonary/Chest: Effort normal and breath sounds normal. No respiratory distress. He has no wheezes. He has no rales. He exhibits no tenderness.  Musculoskeletal: Normal range of motion. He exhibits no edema.  Lymphadenopathy:    He has no cervical adenopathy.  Neurological: He is alert and oriented to person, place, and time.  Skin: Skin is warm and dry.  Psychiatric: He has a normal mood and affect. His behavior is normal. Judgment and thought content normal.  Nursing note and vitals reviewed.   ED Course  Procedures (including critical care time) Labs Review Labs Reviewed  RAPID STREP SCREEN (NOT AT [facility])  CULTURE, GROUP A STREP ([facility] ONLY)    Imaging Review No results found.   MDM   1. Viral  pharyngitis    I advised the patient that no antibiotics are needed at this time. His pharyngitis mostly from a viral source. He quite possibly contacted this through his child. Advised him to consider salt water gargles as necessary or to use lozenges for pain. He will also need to increase his fluid intake and use ibuprofen as necessary for the inflammatory response. Rest and to call in 48 hours for the results of the throat culture. He was at work today for hydration and rest and  return to work full duty tomorrow   Lorin Picket, PA-C 12/04/14 [phone]

## 2014-12-04 NOTE — Discharge Instructions (Signed)

## 2014-12-06 LAB — CULTURE, GROUP A STREP (THRC)

## 2014-12-08 ENCOUNTER — Telehealth: Payer: Self-pay | Admitting: Emergency Medicine

## 2014-12-08 NOTE — ED Notes (Signed)
Patient notified that his Strep C was positive.  PNC 500mg  bid for 10 days dispense 20 with no refills called into [facility] in [place] per Dr. Alphonzo Cruise.  Patient notified to pick up his prescription at [facility].  Patient to finish his antibiotic and if symptoms do not improve or worsen to follow-up with here or with his PCP.  Patient verbalized understanding.

## 2015-02-18 ENCOUNTER — Encounter: Payer: Self-pay | Admitting: Emergency Medicine

## 2015-02-18 ENCOUNTER — Ambulatory Visit
Admission: EM | Admit: 2015-02-18 | Discharge: 2015-02-18 | Disposition: A | Discharge status: Home / Self Care | Payer: BLUE CROSS/BLUE SHIELD | Attending: Family Medicine | Admitting: Family Medicine

## 2015-02-18 DIAGNOSIS — J0100 Acute maxillary sinusitis, unspecified: Secondary | ICD-10-CM

## 2015-02-18 DIAGNOSIS — H9202 Otalgia, left ear: Secondary | ICD-10-CM

## 2015-02-18 MED ORDER — LORATADINE 10 MG PO TABS: 10.0000 mg | ORAL_TABLET | Freq: Every day | ORAL | Status: DC

## 2015-02-18 MED ORDER — AZITHROMYCIN 250 MG PO TABS: ORAL_TABLET | ORAL | Status: DC

## 2015-02-18 NOTE — Discharge Instructions (Signed)
Follow up with your primary care physician as needed. Return to Urgent care for new or worsening concerns.    Earache An earache, also called otalgia, can be caused by many things. Pain from an earache can be sharp, dull, or burning. The pain may be temporary or constant. Earaches can be caused by problems with the ear, such as infection in either the middle ear or the ear canal, injury, impacted ear wax, middle ear pressure, or a foreign body in the ear. Ear pain can also result from problems in other areas. This is called referred pain. For example, pain can come from a sore throat, a tooth infection, or problems with the jaw or the joint between the jaw and the skull (temporomandibular joint, or TMJ). The cause of an earache is not always easy to identify. Watchful waiting may be appropriate for some earaches until a clear cause of the pain can be found. HOME CARE INSTRUCTIONS Watch your condition for any changes. The following actions may help to lessen any discomfort that you are feeling:  Take medicines only as directed by your health care provider. This includes ear drops.  Apply ice to your outer ear to help reduce pain.  Put ice in a plastic bag.  Place a towel between your skin and the bag.  Leave the ice on for 20 minutes, 2-3 times per day.  Do not put anything in your ear other than medicine that is prescribed by your health care provider.  Try resting in an upright position instead of lying down. This may help to reduce pressure in the middle ear and relieve pain.  Chew gum if it helps to relieve your ear pain.  Control any allergies that you have.  Keep all follow-up visits as directed by your health care provider. This is important. SEEK MEDICAL CARE IF:  Your pain does not improve within 2 days.  You have a fever.  You have new or worsening symptoms. SEEK IMMEDIATE MEDICAL CARE IF:  You have a severe headache.  You have a stiff neck.  You have difficulty  swallowing.  You have redness or swelling behind your ear.  You have drainage from your ear.  You have hearing loss.  You feel dizzy.   This information is not intended to replace advice given to you by your health care provider. Make sure you discuss any questions you have with your health care provider.   Document Released: 11/28/2003 Document Revised: 05/03/2014 Document Reviewed: 11/11/2013 Elsevier Interactive Patient Education 2016 [employer]  Sinusitis, Adult Sinusitis is redness, soreness, and inflammation of the paranasal sinuses. Paranasal sinuses are air pockets within the bones of your face. They are located beneath your eyes, in the middle of your forehead, and above your eyes. In healthy paranasal sinuses, mucus is able to drain out, and air is able to circulate through them by way of your nose. However, when your paranasal sinuses are inflamed, mucus and air can become trapped. This can allow bacteria and other germs to grow and cause infection. Sinusitis can develop quickly and last only a short time (acute) or continue over a long period (chronic). Sinusitis that lasts for more than 12 weeks is considered chronic. CAUSES Causes of sinusitis include:  Allergies.  Structural abnormalities, such as displacement of the cartilage that separates your nostrils (deviated septum), which can decrease the air flow through your nose and sinuses and affect sinus drainage.  Functional abnormalities, such as when the small hairs (cilia) that line  your sinuses and help remove mucus do not work properly or are not present. SIGNS AND SYMPTOMS Symptoms of acute and chronic sinusitis are the same. The primary symptoms are pain and pressure around the affected sinuses. Other symptoms include:  Upper toothache.  Earache.  Headache.  Bad breath.  Decreased sense of smell and taste.  A cough, which worsens when you are lying flat.  Fatigue.  Fever.  Thick drainage from your  nose, which often is green and may contain pus (purulent).  Swelling and warmth over the affected sinuses. DIAGNOSIS Your health care provider will perform a physical exam. During your exam, your health care provider may perform any of the following to help determine if you have acute sinusitis or chronic sinusitis:  Look in your nose for signs of abnormal growths in your nostrils (nasal polyps).  Tap over the affected sinus to check for signs of infection.  View the inside of your sinuses using an imaging device that has a light attached (endoscope). If your health care provider suspects that you have chronic sinusitis, one or more of the following tests may be recommended:  Allergy tests.  Nasal culture. A sample of mucus is taken from your nose, sent to a lab, and screened for bacteria.  Nasal cytology. A sample of mucus is taken from your nose and examined by your health care provider to determine if your sinusitis is related to an allergy. TREATMENT Most cases of acute sinusitis are related to a viral infection and will resolve on their own within 10 days. Sometimes, medicines are prescribed to help relieve symptoms of both acute and chronic sinusitis. These may include pain medicines, decongestants, nasal steroid sprays, or saline sprays. However, for sinusitis related to a bacterial infection, your health care provider will prescribe antibiotic medicines. These are medicines that will help kill the bacteria causing the infection. Rarely, sinusitis is caused by a fungal infection. In these cases, your health care provider will prescribe antifungal medicine. For some cases of chronic sinusitis, surgery is needed. Generally, these are cases in which sinusitis recurs more than 3 times per year, despite other treatments. HOME CARE INSTRUCTIONS  Drink plenty of water. Water helps thin the mucus so your sinuses can drain more easily.  Use a humidifier.  Inhale steam 3-4 times a day (for  example, sit in the bathroom with the shower running).  Apply a warm, moist washcloth to your face 3-4 times a day, or as directed by your health care provider.  Use saline nasal sprays to help moisten and clean your sinuses.  Take medicines only as directed by your health care provider.  If you were prescribed either an antibiotic or antifungal medicine, finish it all even if you start to feel better. SEEK IMMEDIATE MEDICAL CARE IF:  You have increasing pain or severe headaches.  You have nausea, vomiting, or drowsiness.  You have swelling around your face.  You have vision problems.  You have a stiff neck.  You have difficulty breathing.   This information is not intended to replace advice given to you by your health care provider. Make sure you discuss any questions you have with your health care provider.   Document Released: 04/12/2005 Document Revised: 05/03/2014 Document Reviewed: 04/27/2011 Elsevier Interactive Patient Education [employer].

## 2015-02-18 NOTE — ED Notes (Signed)
Left ear pain.

## 2015-02-18 NOTE — ED Provider Notes (Signed)
[facility] Emergency Department Provider Note  ____________________________________________  Time seen: Approximately 3:25 PM  I have reviewed the triage vital signs and the nursing notes.   HISTORY  Chief Complaint Otalgia  HPI Patrick Barajas. is a 49 y.o. male   Presents for complaints of left ear discomfort. Patient also reports over the last 2-3 weeks frequent runny nose and nasal congestion. States feels like he constantly has some nasal congestion present and some sinus pressure. Patient states over the last 2 days he has had increased left ear discomfort. States currently left ear discomfort is very minimal at 1 out of 10. States left ear tends to bother him more in the morning. Patient does reports that he sleeps on that ear at night. Patient reports occasionally has some seasonal allergies.  Denies fever, neck pain, chest pain, shortness of breath, abdominal pain, facial swelling, rash, ear drainage, head injury, ear trauma, loss of consciousness, pain radiation. Patient denies grinding teeth at night. Denies pain when opening mouth or chewing food.Reports continues to eat and drink well.    Past Medical History  Diagnosis Date  . Hypertension   . GERD (gastroesophageal reflux disease)   . Anxiety     There are no active problems to display for this patient.   History reviewed. No pertinent past surgical history.  Current Outpatient Rx  Name  Route  Sig  Dispense  Refill  .           .           . lisinopril-hydrochlorothiazide (PRINZIDE,ZESTORETIC) 20-25 MG per tablet   Oral   Take 1 tablet by mouth daily.         .           . omeprazole (PRILOSEC) 40 MG capsule   Oral   Take 40 mg by mouth daily.         .             Allergies Review of patient's allergies indicates no known allergies.  Family History  Problem Relation Age of Onset  . Cancer Mother   . Heart failure Father     Social History Social History   Substance Use Topics  . Smoking status: Never Smoker   . Smokeless tobacco: Former Systems developer  . Alcohol Use: No    Review of Systems Constitutional: No fever/chills Eyes: No visual changes. ENT: No sore throat. Left ear pain, nasal congestion and sinus pressure. Cardiovascular: Denies chest pain. Respiratory: Denies shortness of breath. Gastrointestinal: No abdominal pain.  No nausea, no vomiting.  No diarrhea.  No constipation. Genitourinary: Negative for dysuria. Musculoskeletal: Negative for back pain. Skin: Negative for rash. Neurological: Negative for headaches, focal weakness or numbness.  10-point ROS otherwise negative.  ____________________________________________   PHYSICAL EXAM:  VITAL SIGNS: ED Triage Vitals  Enc Vitals Group     BP 02/18/15 1451 134/78 mmHg     Pulse Rate 02/18/15 1451 67     Resp 02/18/15 1451 20     Temp 02/18/15 1451 98.4 F (36.9 C)     Temp Source 02/18/15 1451 Tympanic     SpO2 02/18/15 1451 100 %     Weight 02/18/15 1451 213 lb (96.616 kg)     Height 02/18/15 1451 5' 10.5" (1.791 m)     Head Cir --      Peak Flow --      Pain Score 02/18/15 1452 2     Pain  Loc --      Pain Edu? --      Excl. in [place]? --     Constitutional: Alert and oriented. Well appearing and in no acute distress. Eyes: Conjunctivae are normal. PERRL. EOMI. Head: [email or web address] TTP bilateral maxillary sinuses, no frontal sinuses TTP, no swelling or erythema.   Ears: no erythema, normal TMs bilaterally except mild left TM dullness. Bilateral ears nontender to palpation.  No  surrounding erythema or swelling.   Nose: mild nasal rhinorrhea, nasal congestion, mild bilateral nasal turbinate erythema and edema. Nares patent.   Mouth/Throat: Mucous membranes are moist.  Oropharynx non-erythematous.No tonsillar swelling or exudate. No uvular shift or deviation.  Neck: No stridor.  No cervical spine tenderness to palpation. Hematological/Lymphatic/Immunilogical: No  cervical lymphadenopathy. Cardiovascular: Normal rate, regular rhythm. Grossly normal heart sounds.  Good peripheral circulation. Respiratory: Normal respiratory effort.  No retractions. Lungs CTAB. Gastrointestinal: Soft and nontender. No distention. Normal Bowel sounds.   Musculoskeletal: No lower or upper extremity tenderness nor edema.  Bilateral pedal pulses equal and easily palpated. No cervical, thoracic or lumbar TTP.  Neurologic:  Normal speech and language. No gross focal neurologic deficits are appreciated. No gait instability. Skin:  Skin is warm, dry and intact. No rash noted. Psychiatric: Mood and affect are normal. Speech and behavior are normal.    ____________________________________________   LABS (all labs ordered are listed, but only abnormal results are displayed)  Labs Reviewed - No data to display   INITIAL IMPRESSION / ASSESSMENT AND PLAN / ED COURSE  Pertinent labs & imaging results that were available during my care of the patient were reviewed by me and considered in my medical decision making (see chart for details).  Very well-appearing patient. No acute distress. Presents for the complaint of left ear discomfort. Patient very well-appearing. States minimal left ear discomfort at this time. States worse in the morning with increased congestion. Patient also with reports of  3 weeks of runny nose and nasal congestion. Patient with some maxillary sinus tenderness to palpation and nasal congestion. Left ear mild dullness. No otitis media, TMJ joint nontender, left ear otherwise normal-appearing. No surrounding erythema or swelling.   Suspect allergic symptoms. However with patient reports of continued nasal congestion for 2-3 weeks counseled patient will go ahead and initiate oral antihistamine Claritin as well as some supportive treatments including rest, fluids, changing of sleeping positions and not sleeping directly on the left ear as often, and discussed if  symptoms do not improve over the next 2-3 days then initiate antibiotics as may be radiating from sinusitis. Will send with prescription of azithromycin. Discussed rest. Discussed close follow-up and return parameters.Discussed follow up with Primary care physician this week. Discussed follow up and return parameters including no resolution or any worsening concerns. Patient verbalized understanding and agreed to plan.   ____________________________________________    FINAL CLINICAL IMPRESSION(S) / ED DIAGNOSES  Final diagnoses:  Otalgia, left  Acute maxillary sinusitis, recurrence not specified       Marylene Land, NP 02/18/15 1658

## 2016-05-18 ENCOUNTER — Other Ambulatory Visit: Payer: Self-pay | Admitting: Nurse Practitioner

## 2016-05-18 DIAGNOSIS — R1011 Right upper quadrant pain: Secondary | ICD-10-CM

## 2016-05-21 ENCOUNTER — Ambulatory Visit: Payer: Self-pay

## 2016-05-27 ENCOUNTER — Ambulatory Visit
Admission: RE | Admit: 2016-05-27 | Discharge: 2016-05-27 | Disposition: A | Discharge status: Home / Self Care | Payer: BLUE CROSS/BLUE SHIELD | Source: Ambulatory Visit | Attending: Nurse Practitioner | Admitting: Nurse Practitioner

## 2016-05-27 DIAGNOSIS — K760 Fatty (change of) liver, not elsewhere classified: Secondary | ICD-10-CM | POA: Insufficient documentation

## 2016-05-27 DIAGNOSIS — R1011 Right upper quadrant pain: Secondary | ICD-10-CM | POA: Diagnosis present

## 2016-06-05 ENCOUNTER — Emergency Department
Admission: EM | Admit: 2016-06-05 | Discharge: 2016-06-05 | Disposition: A | Discharge status: Home / Self Care | Payer: BLUE CROSS/BLUE SHIELD | Attending: Emergency Medicine | Admitting: Emergency Medicine

## 2016-06-05 ENCOUNTER — Emergency Department: Payer: BLUE CROSS/BLUE SHIELD

## 2016-06-05 ENCOUNTER — Encounter: Payer: Self-pay | Admitting: Medical Oncology

## 2016-06-05 DIAGNOSIS — I10 Essential (primary) hypertension: Secondary | ICD-10-CM | POA: Insufficient documentation

## 2016-06-05 DIAGNOSIS — K2970 Gastritis, unspecified, without bleeding: Secondary | ICD-10-CM | POA: Insufficient documentation

## 2016-06-05 DIAGNOSIS — Z87891 Personal history of nicotine dependence: Secondary | ICD-10-CM | POA: Insufficient documentation

## 2016-06-05 DIAGNOSIS — Z79899 Other long term (current) drug therapy: Secondary | ICD-10-CM | POA: Insufficient documentation

## 2016-06-05 DIAGNOSIS — R079 Chest pain, unspecified: Secondary | ICD-10-CM | POA: Diagnosis present

## 2016-06-05 LAB — CBC
HCT: 42.7 % (ref 40.0–52.0)
Hemoglobin: 15.6 g/dL (ref 13.0–18.0)
MCH: 34.8 pg — AB (ref 26.0–34.0)
MCHC: 36.5 g/dL — AB (ref 32.0–36.0)
MCV: 95.3 fL (ref 80.0–100.0)
Platelets: 365 10*3/uL (ref 150–440)
RBC: 4.48 MIL/uL (ref 4.40–5.90)
RDW: 13.2 % (ref 11.5–14.5)
WBC: 8.7 10*3/uL (ref 3.8–10.6)

## 2016-06-05 LAB — BASIC METABOLIC PANEL
Anion gap: 8 (ref 5–15)
BUN: 9 mg/dL (ref 6–20)
CO2: 30 mmol/L (ref 22–32)
Calcium: 9.4 mg/dL (ref 8.9–10.3)
Chloride: 99 mmol/L — ABNORMAL LOW (ref 101–111)
Creatinine, Ser: 1.38 mg/dL — ABNORMAL HIGH (ref 0.61–1.24)
GFR calc Af Amer: >60 mL/min (ref >60)
GFR calc non Af Amer: 59 mL/min — ABNORMAL LOW (ref >60)
GLUCOSE: 123 mg/dL — AB (ref 65–99)
Potassium: 3.6 mmol/L (ref 3.5–5.1)
Sodium: 137 mmol/L (ref 135–145)

## 2016-06-05 LAB — TROPONIN I: Troponin I: <0.03 ng/mL (ref <0.03)

## 2016-06-05 MED ORDER — SUCRALFATE 1 G PO TABS: 1.0000 g | ORAL_TABLET | Freq: Four times a day (QID) | ORAL | 0 refills | Status: DC

## 2016-06-05 NOTE — ED Provider Notes (Signed)
[facility] Emergency Department Provider Note   ____________________________________________   I have reviewed the triage vital signs and the nursing notes.   HISTORY  Chief Complaint Chest Pain   History limited by: Not Limited   HPI Patrick Barajas. is a 51 y.o. male who presents to the emergency department today because of concerns for right upper quadrant abdominal and right chest pain. Patient has been having abdominal pain for the past week and a half. He has been to his primary care physician and they have worked him up for gallbladder disease with an ultrasound. This was negative for gallbladder disease. Patient states that the pain is worse a couple hours after eating. Patient does state that it is been worse after eating greasy or fatty foods. Patient denies any shortness of breath. States that for the past couple of days he has also had right sided chest pain. He denies any fevers. No vomiting. Slight diarrhea.   Past Medical History:  Diagnosis Date  . Anxiety   . GERD (gastroesophageal reflux disease)   . Hypertension     There are no active problems to display for this patient.   History reviewed. No pertinent surgical history.  Prior to Admission medications   Medication Sig Start Date End Date Taking? Authorizing Provider  azithromycin (ZITHROMAX Z-PAK) 250 MG tablet Take 2 tablets (500 mg) on  Day 1,  followed by 1 tablet (250 mg) once daily on Days 2 through 5. 02/18/15   Marylene Land, NP  doxycycline (VIBRAMYCIN) 100 MG capsule Take 100 mg by mouth 2 (two) times daily.    Historical Provider, MD  lisinopril-hydrochlorothiazide (PRINZIDE,ZESTORETIC) 20-25 MG per tablet Take 1 tablet by mouth daily.    Historical Provider, MD  loratadine (CLARITIN) 10 MG tablet Take 1 tablet (10 mg total) by mouth daily. 02/18/15   Marylene Land, NP  omeprazole (PRILOSEC) 40 MG capsule Take 40 mg by mouth daily.    Historical Provider, MD   oxyCODONE-acetaminophen (PERCOCET/ROXICET) 5-325 MG per tablet Take 1 tablet by mouth every 6 (six) hours as needed for moderate pain or severe pain. 09/15/14   Paulina Fusi, MD    Allergies Patient has no known allergies.  Family History  Problem Relation Age of Onset  . Cancer Mother   . Heart failure Father     Social History Social History  Substance Use Topics  . Smoking status: Never Smoker  . Smokeless tobacco: Former Systems developer  . Alcohol use No    Review of Systems  Constitutional: Negative for fever. Cardiovascular: Positive for right sided chest pain. Respiratory: Negative for shortness of breath. Gastrointestinal: Positive for right upper quadrant abdominal pain, nausea and diarrhea. Neurological: Negative for headaches, focal weakness or numbness.  10-point ROS otherwise negative.  ____________________________________________   PHYSICAL EXAM:  VITAL SIGNS: ED Triage Vitals  Enc Vitals Group     BP 06/05/16 1504 132/86     Pulse Rate 06/05/16 1504 88     Resp 06/05/16 1504 18     Temp 06/05/16 1504 97.9 F (36.6 C)     Temp Source 06/05/16 1504 Oral     SpO2 06/05/16 1504 99 %     Weight 06/05/16 1504 200 lb (90.7 kg)     Height 06/05/16 1504 5\' 11"  (1.803 m)     Head Circumference --      Peak Flow --      Pain Score 06/05/16 1505 2   Constitutional: Alert and oriented. Well  appearing and in no distress. Eyes: Conjunctivae are normal. Normal extraocular movements. ENT   Head: Normocephalic and atraumatic.   Nose: No congestion/rhinnorhea.   Mouth/Throat: Mucous membranes are moist.   Neck: No stridor. Hematological/Lymphatic/Immunilogical: No cervical lymphadenopathy. Cardiovascular: Normal rate, regular rhythm.  No murmurs, rubs, or gallops.  Respiratory: Normal respiratory effort without tachypnea nor retractions. Breath sounds are clear and equal bilaterally. No wheezes/rales/rhonchi. Gastrointestinal: Soft and non tender. No  rebound. No guarding.  Genitourinary: Deferred Musculoskeletal: Normal range of motion in all extremities. No lower extremity edema. Neurologic:  Normal speech and language. No gross focal neurologic deficits are appreciated.  Skin:  Skin is warm, dry and intact. No rash noted. Psychiatric: Mood and affect are normal. Speech and behavior are normal. Patient exhibits appropriate insight and judgment.  ____________________________________________    LABS (pertinent positives/negatives)  Labs Reviewed  BASIC METABOLIC PANEL - Abnormal; Notable for the following:       Result Value   Chloride 99 (*)    Glucose, Bld 123 (*)    Creatinine, Ser 1.38 (*)    GFR calc non Af Amer 59 (*)    All other components within normal limits  CBC - Abnormal; Notable for the following:    MCH 34.8 (*)    MCHC 36.5 (*)    All other components within normal limits  TROPONIN I     ____________________________________________   EKG  I, Nance Pear, attending physician, personally viewed and interpreted this EKG  EKG Time: 1457 Rate: 75 Rhythm: normal sinus rhythm Axis: left axis deviation Intervals: qtc 413 QRS: LAFB ST changes: no st elevation Impression: abnormal ekg   ____________________________________________    RADIOLOGY  CXR IMPRESSION: No active cardiopulmonary disease.  ____________________________________________   PROCEDURES  Procedures  ____________________________________________   INITIAL IMPRESSION / ASSESSMENT AND PLAN / ED COURSE  Pertinent labs & imaging results that were available during my care of the patient were reviewed by me and considered in my medical decision making (see chart for details).  Patient presented to the emergency department with continued right upper quadrant abdominal pain as well as right-sided chest pain. So far outpatient workup revealed a negative right upper quadrant ultrasound. This point given the timing that the pain is  worse after eating I think gastritis and duodenitis a likely. I did discuss this with the patient. We discussed diet change. Additionally I will add on sucralfate. Patient is already on antacid.   ____________________________________________   FINAL CLINICAL IMPRESSION(S) / ED DIAGNOSES  Final diagnoses:  Gastritis without bleeding, unspecified chronicity, unspecified gastritis type     Note: This dictation was prepared with Dragon dictation. Any transcriptional errors that result from this process are unintentional     Nance Pear, MD 06/05/16 [phone]

## 2016-06-05 NOTE — Discharge Instructions (Signed)
Please seek medical attention for any high fevers, chest pain, shortness of breath, change in behavior, persistent vomiting, bloody stool or any other new or concerning symptoms.  

## 2016-06-05 NOTE — ED Triage Notes (Signed)
Pt reports he has been having rt upper abd pain off and on for over a week, was seen by PCP and [place] of gallbladder was done and cleared. Pt reports yesterday his pain worsened and is in the rt side of his chest.

## 2017-03-25 ENCOUNTER — Encounter: Payer: Self-pay | Admitting: Emergency Medicine

## 2017-03-25 ENCOUNTER — Other Ambulatory Visit: Payer: Self-pay

## 2017-03-25 ENCOUNTER — Emergency Department
Admission: EM | Admit: 2017-03-25 | Discharge: 2017-03-25 | Disposition: A | Discharge status: Home / Self Care | Payer: BLUE CROSS/BLUE SHIELD | Attending: Emergency Medicine | Admitting: Emergency Medicine

## 2017-03-25 ENCOUNTER — Emergency Department: Payer: BLUE CROSS/BLUE SHIELD

## 2017-03-25 DIAGNOSIS — M7918 Myalgia, other site: Secondary | ICD-10-CM

## 2017-03-25 DIAGNOSIS — Z79899 Other long term (current) drug therapy: Secondary | ICD-10-CM | POA: Diagnosis not present

## 2017-03-25 DIAGNOSIS — I10 Essential (primary) hypertension: Secondary | ICD-10-CM | POA: Diagnosis not present

## 2017-03-25 DIAGNOSIS — R079 Chest pain, unspecified: Secondary | ICD-10-CM | POA: Insufficient documentation

## 2017-03-25 DIAGNOSIS — M791 Myalgia, unspecified site: Secondary | ICD-10-CM | POA: Diagnosis not present

## 2017-03-25 LAB — CBC WITH DIFFERENTIAL/PLATELET
Basophils Absolute: 0 10*3/uL (ref 0–0.1)
Basophils Relative: 1 %
EOS ABS: 0.3 10*3/uL (ref 0–0.7)
Eosinophils Relative: 7 %
HEMATOCRIT: 42.2 % (ref 40.0–52.0)
HEMOGLOBIN: 14.7 g/dL (ref 13.0–18.0)
LYMPHS ABS: 1.6 10*3/uL (ref 1.0–3.6)
Lymphocytes Relative: 34 %
MCH: 34 pg (ref 26.0–34.0)
MCHC: 34.9 g/dL (ref 32.0–36.0)
MCV: 97.4 fL (ref 80.0–100.0)
MONOS PCT: 9 %
Monocytes Absolute: 0.4 10*3/uL (ref 0.2–1.0)
NEUTROS ABS: 2.4 10*3/uL (ref 1.4–6.5)
NEUTROS PCT: 49 %
Platelets: 314 10*3/uL (ref 150–440)
RBC: 4.33 MIL/uL — ABNORMAL LOW (ref 4.40–5.90)
RDW: 12.6 % (ref 11.5–14.5)
WBC: 4.8 10*3/uL (ref 3.8–10.6)

## 2017-03-25 LAB — COMPREHENSIVE METABOLIC PANEL
ALBUMIN: 4.1 g/dL (ref 3.5–5.0)
ALT: 38 U/L (ref 17–63)
AST: 43 U/L — AB (ref 15–41)
Alkaline Phosphatase: 75 U/L (ref 38–126)
Anion gap: 13 (ref 5–15)
BUN: 8 mg/dL (ref 6–20)
CHLORIDE: 103 mmol/L (ref 101–111)
CO2: 23 mmol/L (ref 22–32)
CREATININE: 0.86 mg/dL (ref 0.61–1.24)
Calcium: 9.3 mg/dL (ref 8.9–10.3)
GFR calc non Af Amer: >60 mL/min (ref >60)
Glucose, Bld: 100 mg/dL — ABNORMAL HIGH (ref 65–99)
Potassium: 3.9 mmol/L (ref 3.5–5.1)
SODIUM: 139 mmol/L (ref 135–145)
Total Bilirubin: 1 mg/dL (ref 0.3–1.2)
Total Protein: 7.8 g/dL (ref 6.5–8.1)

## 2017-03-25 LAB — TROPONIN I
Troponin I: <0.03 ng/mL (ref <0.03)
Troponin I: <0.03 ng/mL (ref <0.03)

## 2017-03-25 MED ORDER — IBUPROFEN 600 MG PO TABS: 600.0000 mg | ORAL_TABLET | Freq: Three times a day (TID) | ORAL | 0 refills | Status: DC | PRN

## 2017-03-25 MED ORDER — ASPIRIN 81 MG PO CHEW
324.0000 mg | CHEWABLE_TABLET | Freq: Once | ORAL | Status: AC
  Administered 2017-03-25: 324 mg via ORAL
  Filled 2017-03-25: qty 4

## 2017-03-25 NOTE — ED Provider Notes (Addendum)
[facility] Emergency Department Provider Note ____________________________________________   I have reviewed the triage vital signs and the triage nursing note.  HISTORY  Chief Complaint Chest Pain   Historian Patient  HPI Patrick Barajas. is a 51 y.o. male presenting for evaluation of chest discomfort/chest pain.  Patient states that he unloaded a bunch of Christmas trees this morning before work around 6 AM and when he went in around 7 AM, he noticed that he was having a significant amount of left-sided chest discomfort/chest pain is moderate to severe and worse with moving his trunk and moving his left arm.  No associated symptoms of nausea, sweats, vomiting, abdominal pain, numbness or weakness.  He does have a history of GERD hypertension no known coronary artery disease history.  And is much better now.  It comes back on somewhat when he moves his left arm, pain into the left chest.   Past Medical History:  Diagnosis Date  . Anxiety   . GERD (gastroesophageal reflux disease)   . Hypertension     There are no active problems to display for this patient.   History reviewed. No pertinent surgical history.  Prior to Admission medications   Medication Sig Start Date End Date Taking? Authorizing Provider  azithromycin (ZITHROMAX Z-PAK) 250 MG tablet Take 2 tablets (500 mg) on  Day 1,  followed by 1 tablet (250 mg) once daily on Days 2 through 5. 02/18/15   Marylene Land, NP  doxycycline (VIBRAMYCIN) 100 MG capsule Take 100 mg by mouth 2 (two) times daily.    [provider]  lisinopril-hydrochlorothiazide (PRINZIDE,ZESTORETIC) 20-25 MG per tablet Take 1 tablet by mouth daily.    [provider]  loratadine (CLARITIN) 10 MG tablet Take 1 tablet (10 mg total) by mouth daily. 02/18/15   Marylene Land, NP  omeprazole (PRILOSEC) 40 MG capsule Take 40 mg by mouth daily.    [provider]  oxyCODONE-acetaminophen  (PERCOCET/ROXICET) 5-325 MG per tablet Take 1 tablet by mouth every 6 (six) hours as needed for moderate pain or severe pain. 09/15/14   Paulina Fusi, MD  sucralfate (CARAFATE) 1 g tablet Take 1 tablet (1 g total) by mouth 4 (four) times daily. 06/05/16   Nance Pear, MD    No Known Allergies  Family History  Problem Relation Age of Onset  . Cancer Mother   . Heart failure Father     Social History Social History   Tobacco Use  . Smoking status: Never Smoker  . Smokeless tobacco: Former Network engineer Use Topics  . Alcohol use: No  . Drug use: No    Review of Systems  Constitutional: Negative for fever. Eyes: Negative for visual changes. ENT: Negative for sore throat. Cardiovascular: Positive for chest pain. Respiratory: Negative for shortness of breath. Gastrointestinal: Negative for abdominal pain, vomiting and diarrhea. Genitourinary: Negative for dysuria. Musculoskeletal: Negative for back pain. Skin: Negative for rash. Neurological: Negative for headache.  ____________________________________________   PHYSICAL EXAM:  VITAL SIGNS: ED Triage Vitals  Enc Vitals Group     BP 03/25/17 0744 (!) 146/98     Pulse Rate 03/25/17 0744 92     Resp 03/25/17 0744 20     Temp 03/25/17 0744 98.8 F (37.1 C)     Temp Source 03/25/17 0744 Oral     SpO2 03/25/17 0744 97 %     Weight 03/25/17 0744 185 lb (83.9 kg)     Height 03/25/17 0744 5\' 11"  (1.803  m)     Head Circumference --      Peak Flow --      Pain Score 03/25/17 0737 4     Pain Loc --      Pain Edu? --      Excl. in [place]? --      Constitutional: Alert and oriented. Well appearing and in no distress. HEENT   Head: Normocephalic and atraumatic.      Eyes: Conjunctivae are normal. Pupils equal and round.       Ears:         Nose: No congestion/rhinnorhea.   Mouth/Throat: Mucous membranes are moist.   Neck: No stridor. Cardiovascular/Chest: Normal rate, regular rhythm.  No murmurs, rubs, or  gallops. Respiratory: Normal respiratory effort without tachypnea nor retractions. Breath sounds are clear and equal bilaterally. No wheezes/rales/rhonchi. Gastrointestinal: Soft. No distention, no guarding, no rebound. Nontender.    Genitourinary/rectal:Deferred Musculoskeletal: Nontender with normal range of motion in all extremities. No joint effusions.  No lower extremity tenderness.  No edema. Neurologic:  Normal speech and language. No gross or focal neurologic deficits are appreciated. Skin:  Skin is warm, dry and intact. No rash noted. Psychiatric: Mood and affect are normal. Speech and behavior are normal. Patient exhibits appropriate insight and judgment.   ____________________________________________  LABS (pertinent positives/negatives) I, Lisa Roca, MD the attending physician have reviewed the labs noted below.  Labs Reviewed  COMPREHENSIVE METABOLIC PANEL - Abnormal; Notable for the following components:      Result Value   Glucose, Bld 100 (*)    AST 43 (*)    All other components within normal limits  CBC WITH DIFFERENTIAL/PLATELET - Abnormal; Notable for the following components:   RBC 4.33 (*)    All other components within normal limits  TROPONIN I  TROPONIN I    ____________________________________________    EKG I, Lisa Roca, MD, the attending physician have personally viewed and interpreted all ECGs.  107 bpm.  Sinus tachycardia.  Incomplete right bundle branch block.  Q waves inferiorly.  Nonspecific ST and T wave ____________________________________________  RADIOLOGY All Xrays were viewed by me.  Imaging interpreted by Radiologist, and I, Lisa Roca, MD the attending physician have reviewed the radiologist interpretation noted below.  Chest x-ray 2 view:  IMPRESSION: No active cardiopulmonary disease. Chest is stable from prior exam. __________________________________________  PROCEDURES  Procedure(s) performed: None  Critical Care  performed: None   ____________________________________________  ED COURSE / ASSESSMENT AND PLAN  Pertinent labs & imaging results that were available during my care of the patient were reviewed by me and considered in my medical decision making (see chart for details).   He exerted himself quite heavily lifting heavy Christmas trees this morning which is unusual for him, and then had reproducible pain to the left sided chest wall when he lifts his arm moves his trunk, I am most suspicious for musculoskeletal cause.  However, given chest discomfort, will evaluate for heart attack.  I am less suspicious for ACS, and his EKG is overall reassuring although there are Q waves inferiorly, raising suspicion for possible underlying coronary disease.  I am going to go ahead and give him aspirin.  He is not really having any ongoing discomfort other than if he moves his left arm.  We discussed initial labs are reassuring with a negative troponin.  I recommended repeat troponin in several hours, but patient states he does not want to stay for this.  He understands the risk of  missed heart attack including death and chooses to go home.  He is overall reasonable, and is capable of making his own decisions weighing the risks and benefits.  I am going to refer him to cardiology.  Patient is agreeable for 2 hour troponin blood draw, now, and I will call him to let him know the result.     DIFFERENTIAL DIAGNOSIS: Differential diagnosis includes, but is not limited to, ACS, aortic dissection, pulmonary embolism, cardiac tamponade, pneumothorax, pneumonia, pericarditis, myocarditis, GI-related causes including esophagitis/gastritis, and musculoskeletal chest wall pain.    CONSULTATIONS:  None   Patient / Family / Caregiver informed of clinical course, medical decision-making process, and agree with plan.   I discussed return precautions, follow-up instructions, and discharge instructions with patient  and/or family.  Discharge Instructions : You were evaluated for chest discomfort and found to have overall reassuring evaluation here in the emergency department today.  Although no certain cause was found, your exam and evaluation are reassuring in the emergency department today.  As we discussed, I recommended that he stay for repeat troponin lab testing to evaluate for heart attack, but you chose to decline and go home.  We discussed risk of missed heart attack, resulting in potentially heart failure or death.  Return to emerge department immediately for any new or worsening symptoms including chest pain, nausea, sweats, or passing out, palpitations, trouble breathing, weakness, numbness, fever, or any other symptoms concerning to you.  Go ahead and take 81 mg aspirin daily until evaluation with a cardiologist.   Addendum to include I reviewed troponin repeat was less than 0.03 and a call the patient to let him know. ___________________________________________   FINAL CLINICAL IMPRESSION(S) / ED DIAGNOSES   Final diagnoses:  Nonspecific chest pain  Musculoskeletal pain      ___________________________________________        Note: This dictation was prepared with Dragon dictation. Any transcriptional errors that result from this process are unintentional    Lisa Roca, MD 03/25/17 1036    Lisa Roca, MD 03/25/17 [phone]

## 2017-03-25 NOTE — ED Notes (Signed)
Pt returned from Xray at this time  

## 2017-03-25 NOTE — Discharge Instructions (Signed)
You were evaluated for chest discomfort and found to have overall reassuring evaluation here in the emergency department today.  Although no certain cause was found, your exam and evaluation are reassuring in the emergency department today.  As we discussed, I recommended that he stay for repeat troponin lab testing to evaluate for heart attack, but you chose to decline and go home.  We discussed risk of missed heart attack, resulting in potentially heart failure or death.  Return to emerge department immediately for any new or worsening symptoms including chest pain, nausea, sweats, or passing out, palpitations, trouble breathing, weakness, numbness, fever, or any other symptoms concerning to you.  Go ahead and take 81 mg aspirin daily until evaluation with a cardiologist.

## 2017-03-25 NOTE — ED Notes (Signed)
Patient transported to X-ray 

## 2017-03-25 NOTE — ED Triage Notes (Signed)
Patient presents to the ED with sharp left sided chest pain after unloading christmas trees at work.  Patient states he was slightly diaphoretic and nauseous.  Patient reports some shortness of breath.  Patient states he made his manager aware.  Patient works at the [employer].

## 2017-03-25 NOTE — ED Notes (Signed)
Pt ambulatory to POV, skin PWD, NAD, Resp non-labored and equal. Arm still sore with movement, troponin drawn Dr. Reita Cliche to call with results. No questions or concerned voiced during discharge instruction.

## 2017-07-18 ENCOUNTER — Ambulatory Visit
Admission: EM | Admit: 2017-07-18 | Discharge: 2017-07-18 | Disposition: A | Discharge status: Home / Self Care | Payer: BLUE CROSS/BLUE SHIELD | Attending: Family Medicine | Admitting: Family Medicine

## 2017-07-18 ENCOUNTER — Ambulatory Visit (INDEPENDENT_AMBULATORY_CARE_PROVIDER_SITE_OTHER): Payer: BLUE CROSS/BLUE SHIELD

## 2017-07-18 ENCOUNTER — Encounter: Payer: Self-pay | Admitting: Emergency Medicine

## 2017-07-18 ENCOUNTER — Other Ambulatory Visit: Payer: Self-pay

## 2017-07-18 DIAGNOSIS — S93602A Unspecified sprain of left foot, initial encounter: Secondary | ICD-10-CM

## 2017-07-18 LAB — URINALYSIS, COMPLETE (UACMP) WITH MICROSCOPIC
GLUCOSE, UA: NEGATIVE mg/dL
HGB URINE DIPSTICK: NEGATIVE
NITRITE: NEGATIVE
PH: 6 (ref 5.0–8.0)
RBC / HPF: NONE SEEN RBC/hpf (ref 0–5)
SQUAMOUS EPITHELIAL / LPF: NONE SEEN
Specific Gravity, Urine: 1.025 (ref 1.005–1.030)

## 2017-07-18 LAB — URIC ACID: URIC ACID, SERUM: 8.5 mg/dL — AB (ref 4.4–7.6)

## 2017-07-18 MED ORDER — NAPROXEN 500 MG PO TABS: 500.0000 mg | ORAL_TABLET | Freq: Two times a day (BID) | ORAL | 0 refills | Status: DC

## 2017-07-18 NOTE — ED Provider Notes (Signed)
[facility]    CSN: [id number] Arrival date & time: 07/18/17  1049     History   Chief Complaint Chief Complaint  Patient presents with  . Foot Pain    HPI Patrick Barajas. is a 52 y.o. male.   HPI 52 year old male who presents with left foot pain indicating the superior lateral aspect started this morning.  He does have a history of gout.  He states that he has been running and walking with his 32-1/2-year-old son for 2-3 hours yesterday.  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  He does admit to drinking more beer during the basketball tournament.  He states that it does not hurt unless he is actually walking on his foot.  No fever or chills.       Past Medical History:  Diagnosis Date  . Anxiety   . GERD (gastroesophageal reflux disease)   . Hypertension     There are no active problems to display for this patient.   History reviewed. No pertinent surgical history.     Home Medications    Prior to Admission medications   Medication Sig Start Date End Date Taking? Authorizing Provider  ibuprofen (ADVIL,MOTRIN) 600 MG tablet Take 1 tablet (600 mg total) by mouth every 8 (eight) hours as needed. 03/25/17  Yes Lisa Roca, MD  lisinopril-hydrochlorothiazide (PRINZIDE,ZESTORETIC) 20-25 MG per tablet Take 1 tablet by mouth daily.   Yes [provider]  omeprazole (PRILOSEC) 40 MG capsule Take 40 mg by mouth daily.   Yes [provider]  loratadine (CLARITIN) 10 MG tablet Take 1 tablet (10 mg total) by mouth daily. 02/18/15   Marylene Land, NP  naproxen (NAPROSYN) 500 MG tablet Take 1 tablet (500 mg total) by mouth 2 (two) times daily with a meal. 07/18/17   Lorin Picket, PA-C  sucralfate (CARAFATE) 1 g tablet Take 1 tablet (1 g total) by mouth 4 (four) times daily. 06/05/16   Nance Pear, MD    Family History Family History  Problem Relation Age of Onset  . Cancer Mother   . Heart failure Father      Social History Social History   Tobacco Use  . Smoking status: Never Smoker  . Smokeless tobacco: Former Network engineer Use Topics  . Alcohol use: No  . Drug use: No     Allergies   Patient has no known allergies.   Review of Systems Review of Systems  Constitutional: Positive for activity change. Negative for chills, fatigue and fever.  Musculoskeletal: Positive for arthralgias.  All other systems reviewed and are negative.    Physical Exam Triage Vital Signs ED Triage Vitals  Enc Vitals Group     BP 07/18/17 1111 134/84     Pulse Rate 07/18/17 1111 87     Resp 07/18/17 1111 16     Temp 07/18/17 1111 99.3 F (37.4 C)     Temp Source 07/18/17 1111 Oral     SpO2 07/18/17 1111 98 %     Weight 07/18/17 1109 190 lb (86.2 kg)     Height 07/18/17 1109 5\' 11"  (1.803 m)     Head Circumference --      Peak Flow --      Pain Score 07/18/17 1109 6     Pain Loc --      Pain Edu? --      Excl. in [place]? --    No data found.  Updated Vital  Signs BP 134/84 (BP Location: Left Arm)   Pulse 87   Temp 99.3 F (37.4 C) (Oral)   Resp 16   Ht 5\' 11"  (1.803 m)   Wt 190 lb (86.2 kg)   SpO2 98%   BMI 26.50 kg/m   Visual Acuity Right Eye Distance:   Left Eye Distance:   Bilateral Distance:    Right Eye Near:   Left Eye Near:    Bilateral Near:     Physical Exam  Constitutional: He is oriented to person, place, and time. He appears well-developed and well-nourished. No distress.  HENT:  Head: Normocephalic.  Eyes: Pupils are equal, round, and reactive to light. EOM are normal. Right eye exhibits no discharge. Left eye exhibits no discharge.  Neck: Normal range of motion.  Musculoskeletal: Normal range of motion. He exhibits tenderness. He exhibits no deformity.  Examination of the left foot shows no significant ecchymosis erythema or swelling. There may be some slight puffiness over the oyster.  No tenderness to compression of the malleolii.  There is tenderness at  the base of the fourth metatarsal cuboid articulation.  There is mild warmth present.  Patient Allows you to examine the foot without withdrawal.  Neurological: He is alert and oriented to person, place, and time.  Skin: Skin is warm and dry. He is not diaphoretic.  Psychiatric: He has a normal mood and affect. His behavior is normal. Judgment and thought content normal.  Nursing note and vitals reviewed.    UC Treatments / Results  Labs (all labs ordered are listed, but only abnormal results are displayed) Labs Reviewed  URIC ACID - Abnormal; Notable for the following components:      Result Value   Uric Acid, Serum 8.5 (*)    All other components within normal limits  URINALYSIS, COMPLETE (UACMP) WITH MICROSCOPIC - Abnormal; Notable for the following components:   Bilirubin Urine SMALL (*)    Ketones, ur TRACE (*)    Protein, ur TRACE (*)    Leukocytes, UA TRACE (*)    Bacteria, UA RARE (*)    All other components within normal limits    EKG None Radiology Dg Foot Complete Left  Result Date: 07/18/2017 CLINICAL DATA:  Left foot pain.  History of gout. EXAM: LEFT FOOT - COMPLETE 3+ VIEW COMPARISON:  None. FINDINGS: The joint spaces are fairly well maintained. There are degenerative changes at the first MTP joint with mild joint space narrowing. Suspect small erosions away from the joint consistent with gout. The other MTP joints are maintained. The intertarsal and tarsal metatarsal joints are maintained. Moderate medial angulation of the first metatarsal, likely congenital. No hindfoot varus or valgus. Moderate pes cavus. IMPRESSION: No acute bony findings. Suspect mild changes of gout involving the first MTP joint. Electronically Signed   By: Marijo Sanes M.D.   On: 07/18/2017 12:01    Procedures Procedures (including critical care time)  Medications Ordered in UC Medications - No data to display   Initial Impression / Assessment and Plan / UC Course  I have reviewed the  triage vital signs and the nursing notes.  Pertinent labs & imaging results that were available during my care of the patient were reviewed by me and considered in my medical decision making (see chart for details).  Clinical Course as of Jul 19 1230  Mon Jul 18, 2017  1220 pH: 6.0 [MH]    Clinical Course User Index [MH] Luvenia Redden, PA-C    Plan:  1. Test/x-ray results and diagnosis reviewed with patient 2. rx as per orders; risks, benefits, potential side effects reviewed with patient 3. Recommend supportive treatment with ice Ace wrap and elevation as necessary to control swelling and pain.  Commended the use of hard soled shoes for more comfort at work.  He is to rest his foot is much as possible to keep her comfortable.  Improving should follow-up with orthopedic surgery.  Told there is no evidence of any gout today.  We did draw UA and uric acid for baseline. 4. F/u prn if symptoms worsen or don't improve   Final Clinical Impressions(s) / UC Diagnoses   Final diagnoses:  Sprain of left foot, initial encounter    ED Discharge Orders        Ordered    naproxen (NAPROSYN) 500 MG tablet  2 times daily with meals     07/18/17 1220       Controlled Substance Prescriptions [place] Controlled Substance Registry consulted? Not Applicable   Lorin Picket, PA-C 07/18/17 1232

## 2017-07-18 NOTE — Discharge Instructions (Signed)
Ice 20 minutes out of every 2 hours 4-5 times daily for swelling and comfort.  Ace wrap on your foot for support.  MayBe helpful to wear hard soled shoe.

## 2017-07-18 NOTE — ED Triage Notes (Signed)
Patient c/o left foot pain that started this morning. Patient has history of gout.

## 2017-07-26 ENCOUNTER — Other Ambulatory Visit: Payer: Self-pay

## 2017-07-26 ENCOUNTER — Encounter: Payer: Self-pay | Admitting: Emergency Medicine

## 2017-07-26 ENCOUNTER — Ambulatory Visit
Admission: EM | Admit: 2017-07-26 | Discharge: 2017-07-26 | Disposition: A | Discharge status: Home / Self Care | Payer: BLUE CROSS/BLUE SHIELD | Attending: Family Medicine | Admitting: Family Medicine

## 2017-07-26 DIAGNOSIS — J069 Acute upper respiratory infection, unspecified: Secondary | ICD-10-CM | POA: Diagnosis not present

## 2017-07-26 DIAGNOSIS — B9789 Other viral agents as the cause of diseases classified elsewhere: Secondary | ICD-10-CM

## 2017-07-26 MED ORDER — HYDROCOD POLST-CPM POLST ER 10-8 MG/5ML PO SUER: 5.0000 mL | Freq: Two times a day (BID) | ORAL | 0 refills | Status: DC | PRN

## 2017-07-26 MED ORDER — PREDNISONE 50 MG PO TABS: ORAL_TABLET | ORAL | 0 refills | Status: DC

## 2017-07-26 NOTE — ED Triage Notes (Signed)
Patient in today c/o nasal congestion, cough x 2 days. Patient denies fever. Patient has tried Delsym and Nyquil.

## 2017-07-26 NOTE — ED Provider Notes (Signed)
[facility]    CSN: [id number] Arrival date & time: 07/26/17  0801  History   Chief Complaint Chief Complaint  Patient presents with  . Nasal Congestion  . Cough   HPI  52 year old male presents with the above complaints.  Patient reports that he has been sick for the past 2 days.  He states that it began with nasal congestion.  Has now worsened and has led to cough.  Cough is severe.  Keeps him up at night.  Associated rib pain from the cough.  Additionally, he reports that he is been wheezing.  He is a non-smoker.  He is tried over-the-counter Delsym and NyQuil without improvement.  Symptoms are worse at night.  No other associated symptoms.  No other complaints or concerns at this time.  Past Medical History:  Diagnosis Date  . Anxiety   . GERD (gastroesophageal reflux disease)   . Hypertension    History reviewed. No pertinent surgical history.  Home Medications    Prior to Admission medications   Medication Sig Start Date End Date Taking? Authorizing Provider  citalopram (CELEXA) 20 MG tablet Take 1 tablet by mouth daily. 05/30/17  Yes [provider]  lisinopril-hydrochlorothiazide (PRINZIDE,ZESTORETIC) 20-25 MG per tablet Take 1 tablet by mouth daily.   Yes [provider]  omeprazole (PRILOSEC) 40 MG capsule Take 40 mg by mouth daily.   Yes [provider]  chlorpheniramine-HYDROcodone (TUSSIONEX PENNKINETIC ER) 10-8 MG/5ML SUER Take 5 mLs by mouth every 12 (twelve) hours as needed. 07/26/17   Coral Spikes, DO  predniSONE (DELTASONE) 50 MG tablet 1 tablet daily x 5 days 07/26/17   Coral Spikes, DO    Family History Family History  Problem Relation Age of Onset  . Cancer Mother   . Heart failure Father     Social History Social History   Tobacco Use  . Smoking status: Never Smoker  . Smokeless tobacco: Former Network engineer Use Topics  . Alcohol use: No  . Drug use: No     Allergies   Patient has no known  allergies.   Review of Systems Review of Systems  Constitutional: Negative for fever.  HENT: Positive for congestion.   Respiratory: Positive for cough and wheezing.    Physical Exam Triage Vital Signs ED Triage Vitals  Enc Vitals Group     BP 07/26/17 0810 (!) 144/86     Pulse Rate 07/26/17 0810 90     Resp 07/26/17 0810 16     Temp 07/26/17 0810 98.3 F (36.8 C)     Temp Source 07/26/17 0810 Oral     SpO2 07/26/17 0810 98 %     Weight 07/26/17 0811 195 lb (88.5 kg)     Height 07/26/17 0811 5\' 11"  (1.803 m)     Head Circumference --      Peak Flow --      Pain Score 07/26/17 0811 0     Pain Loc --      Pain Edu? --      Excl. in [place]? --    Updated Vital Signs BP (!) 144/86 (BP Location: Left Arm)   Pulse 90   Temp 98.3 F (36.8 C) (Oral)   Resp 16   Ht 5\' 11"  (1.803 m)   Wt 195 lb (88.5 kg)   SpO2 98%   BMI 27.20 kg/m     Physical Exam  Constitutional: He is oriented to person, place, and time. He  appears well-developed. No distress.  HENT:  Head: Normocephalic and atraumatic.  Mouth/Throat: Oropharynx is clear and moist.  Eyes: Conjunctivae are normal. Right eye exhibits no discharge. Left eye exhibits no discharge.  Neck: Neck supple.  Cardiovascular: Normal rate and regular rhythm.  No murmur heard. Pulmonary/Chest: Effort normal. He has wheezes. He has no rales.  Lymphadenopathy:    He has no cervical adenopathy.  Neurological: He is alert and oriented to person, place, and time.  Psychiatric: He has a normal mood and affect. His behavior is normal.  Nursing note and vitals reviewed.  UC Treatments / Results  Labs (all labs ordered are listed, but only abnormal results are displayed) Labs Reviewed - No data to display  EKG None Radiology No results found.  Procedures Procedures (including critical care time)  Medications Ordered in UC Medications - No data to display   Initial Impression / Assessment and Plan / UC Course  I have reviewed  the triage vital signs and the nursing notes.  Pertinent labs & imaging results that were available during my care of the patient were reviewed by me and considered in my medical decision making (see chart for details).     52 year old male presents with a viral URI with cough.  Treating with prednisone and Tussionex.  Final Clinical Impressions(s) / UC Diagnoses   Final diagnoses:  Viral URI with cough    ED Discharge Orders        Ordered    predniSONE (DELTASONE) 50 MG tablet     07/26/17 0821    chlorpheniramine-HYDROcodone (TUSSIONEX PENNKINETIC ER) 10-8 MG/5ML SUER  Every 12 hours PRN     07/26/17 0821     Controlled Substance Prescriptions [place] Controlled Substance Registry consulted? Not Applicable   Coral Spikes, [place] 07/26/17 [phone]

## 2017-07-26 NOTE — Discharge Instructions (Signed)
Rest.  OTC decongestant (I prefer the use of flonase).  Cough medication and prednisone as prescribed.  Take care  Dr. Lacinda Axon

## 2017-11-11 ENCOUNTER — Emergency Department: Payer: BLUE CROSS/BLUE SHIELD

## 2017-11-11 ENCOUNTER — Emergency Department
Admission: EM | Admit: 2017-11-11 | Discharge: 2017-11-11 | Disposition: A | Discharge status: Home / Self Care | Payer: BLUE CROSS/BLUE SHIELD | Attending: Emergency Medicine | Admitting: Emergency Medicine

## 2017-11-11 ENCOUNTER — Other Ambulatory Visit: Payer: Self-pay

## 2017-11-11 DIAGNOSIS — Z79899 Other long term (current) drug therapy: Secondary | ICD-10-CM | POA: Insufficient documentation

## 2017-11-11 DIAGNOSIS — I10 Essential (primary) hypertension: Secondary | ICD-10-CM | POA: Diagnosis not present

## 2017-11-11 DIAGNOSIS — F419 Anxiety disorder, unspecified: Secondary | ICD-10-CM | POA: Insufficient documentation

## 2017-11-11 DIAGNOSIS — R079 Chest pain, unspecified: Secondary | ICD-10-CM | POA: Diagnosis present

## 2017-11-11 DIAGNOSIS — R0789 Other chest pain: Secondary | ICD-10-CM | POA: Diagnosis not present

## 2017-11-11 LAB — CBC WITH DIFFERENTIAL/PLATELET
BASOS ABS: 0 10*3/uL (ref 0–0.1)
BASOS PCT: 1 %
EOS ABS: 0.3 10*3/uL (ref 0–0.7)
EOS PCT: 3 %
HCT: 41.9 % (ref 40.0–52.0)
Hemoglobin: 14.9 g/dL (ref 13.0–18.0)
Lymphocytes Relative: 24 %
Lymphs Abs: 2.1 10*3/uL (ref 1.0–3.6)
MCH: 35 pg — ABNORMAL HIGH (ref 26.0–34.0)
MCHC: 35.5 g/dL (ref 32.0–36.0)
MCV: 98.7 fL (ref 80.0–100.0)
MONO ABS: 0.8 10*3/uL (ref 0.2–1.0)
Monocytes Relative: 9 %
NEUTROS ABS: 5.6 10*3/uL (ref 1.4–6.5)
Neutrophils Relative %: 63 %
Platelets: 335 10*3/uL (ref 150–440)
RBC: 4.24 MIL/uL — ABNORMAL LOW (ref 4.40–5.90)
RDW: 13 % (ref 11.5–14.5)
WBC: 8.8 10*3/uL (ref 3.8–10.6)

## 2017-11-11 LAB — COMPREHENSIVE METABOLIC PANEL
ALT: 35 U/L (ref 0–44)
AST: 46 U/L — AB (ref 15–41)
Albumin: 4.1 g/dL (ref 3.5–5.0)
Alkaline Phosphatase: 73 U/L (ref 38–126)
Anion gap: 11 (ref 5–15)
BILIRUBIN TOTAL: 1.6 mg/dL — AB (ref 0.3–1.2)
BUN: 15 mg/dL (ref 6–20)
CO2: 23 mmol/L (ref 22–32)
Calcium: 9.1 mg/dL (ref 8.9–10.3)
Chloride: 103 mmol/L (ref 98–111)
Creatinine, Ser: 1.12 mg/dL (ref 0.61–1.24)
GFR calc Af Amer: >60 mL/min (ref >60)
Glucose, Bld: 126 mg/dL — ABNORMAL HIGH (ref 70–99)
POTASSIUM: 3.6 mmol/L (ref 3.5–5.1)
Sodium: 137 mmol/L (ref 135–145)
TOTAL PROTEIN: 7.8 g/dL (ref 6.5–8.1)

## 2017-11-11 LAB — TROPONIN I

## 2017-11-11 LAB — LIPASE, BLOOD: LIPASE: 61 U/L — AB (ref 11–51)

## 2017-11-11 MED ORDER — GI COCKTAIL ~~LOC~~
30.0000 mL | Freq: Once | ORAL | Status: AC
  Administered 2017-11-11: 30 mL via ORAL
  Filled 2017-11-11: qty 30

## 2017-11-11 NOTE — ED Provider Notes (Addendum)
[facility] Emergency Department Provider Note  ____________________________________________   I have reviewed the triage vital signs and the nursing notes. Where available I have reviewed prior notes and, if possible and indicated, outside hospital notes.    HISTORY  Chief Complaint Chest Pain    HPI Patrick Nusz. is a 52 y.o. male history of anxiety, history of "bad reflux", 3 of hypertension denies personal family history of PE or DVT no recent travel no recent surgery no leg swelling, presents today complaining of a fleeting discomfort in the right side of his chest just above his abdomen.  He has a burning sensation sometimes in his stomach with this.  Nothing makes it better nothing makes it worse, lasts for a few seconds and goes away has been doing this since yesterday.  Not exertional.  Sometimes positional however.  No fever no chills no leg swelling, no exertional symptoms, he states he feels fine when he is working anxious when he sits still he usually feels this.  Last for a few seconds no radiation no other complaints.  No prior treatment.  No other associated symptoms.        Past Medical History:  Diagnosis Date  . Anxiety   . GERD (gastroesophageal reflux disease)   . Hypertension     There are no active problems to display for this patient.   History reviewed. No pertinent surgical history.  Prior to Admission medications   Medication Sig Start Date End Date Taking? Authorizing Provider  chlorpheniramine-HYDROcodone (TUSSIONEX PENNKINETIC ER) 10-8 MG/5ML SUER Take 5 mLs by mouth every 12 (twelve) hours as needed. 07/26/17   Coral Spikes, DO  citalopram (CELEXA) 20 MG tablet Take 1 tablet by mouth daily. 05/30/17   [provider]  lisinopril-hydrochlorothiazide (PRINZIDE,ZESTORETIC) 20-25 MG per tablet Take 1 tablet by mouth daily.    [provider]  omeprazole (PRILOSEC) 40 MG capsule Take 40 mg by mouth daily.     [provider]  predniSONE (DELTASONE) 50 MG tablet 1 tablet daily x 5 days 07/26/17   Coral Spikes, DO    Allergies Patient has no known allergies.  Family History  Problem Relation Age of Onset  . Cancer Mother   . Heart failure Father     Social History Social History   Tobacco Use  . Smoking status: Never Smoker  . Smokeless tobacco: Former Network engineer Use Topics  . Alcohol use: No  . Drug use: No    Review of Systems Constitutional: No fever/chills Eyes: No visual changes. ENT: No sore throat. No stiff neck no neck pain Cardiovascular: + chest pain. Respiratory: Denies shortness of breath. Gastrointestinal:   no vomiting.  No diarrhea.  No constipation. Genitourinary: Negative for dysuria. Musculoskeletal: Negative lower extremity swelling Skin: Negative for rash. Neurological: Negative for severe headaches, focal weakness or numbness.   ____________________________________________   PHYSICAL EXAM:  VITAL SIGNS: ED Triage Vitals  Enc Vitals Group     BP 11/11/17 1043 114/73     Pulse Rate 11/11/17 1042 82     Resp 11/11/17 1042 16     Temp 11/11/17 1042 98.7 F (37.1 C)     Temp Source 11/11/17 1042 Oral     SpO2 11/11/17 1042 98 %     Weight 11/11/17 1041 195 lb (88.5 kg)     Height 11/11/17 1041 5\' 11"  (1.803 m)     Head Circumference --      Peak Flow --  Pain Score 11/11/17 1041 8     Pain Loc --      Pain Edu? --      Excl. in [place]? --     Constitutional: Alert and oriented. Well appearing and in no acute distress. Eyes: Conjunctivae are normal Head: Atraumatic HEENT: No congestion/rhinnorhea. Mucous membranes are moist.  Oropharynx non-erythematous Neck:   Nontender with no meningismus, no masses, no stridor Cardiovascular: Normal rate, regular rhythm. Grossly normal heart sounds.  Good peripheral circulation. Chest: There is some degree of discomfort to the right chest wall, no crepitus no flail chest, redness, margin,  this is where he has his pain.  Also seems to be somewhat elliptical with motion. Respiratory: Normal respiratory effort.  No retractions. Lungs CTAB. Abdominal: Soft and slight amount of epigastric discomfort is detected, nonsurgical abdomen. No distention. No guarding no rebound no focal right upper quadrant pain no focal left-sided abdominal pain no lower abdominal pain Back:  There is no focal tenderness or step off.  there is no midline tenderness there are no lesions noted. there is no CVA tenderness  Musculoskeletal: No lower extremity tenderness, no upper extremity tenderness. No joint effusions, no DVT signs strong distal pulses no edema Neurologic:  Normal speech and language. No gross focal neurologic deficits are appreciated.  Skin:  Skin is warm, dry and intact. No rash noted. Psychiatric: Mood and affect are normal. Speech and behavior are normal.  ____________________________________________   LABS (all labs ordered are listed, but only abnormal results are displayed)  Labs Reviewed  COMPREHENSIVE METABOLIC PANEL  LIPASE, BLOOD  CBC WITH DIFFERENTIAL/PLATELET  TROPONIN I    Pertinent labs  results that were available during my care of the patient were reviewed by me and considered in my medical decision making (see chart for details). ____________________________________________  EKG  I personally interpreted any EKGs ordered by me or triage Normal sinus rhythm rate 83 bpm no acute ST elevation or depression, LAFB noted, no acute ischemia.  LAFB is old ____________________________________________  RADIOLOGY  Pertinent labs & imaging results that were available during my care of the patient were reviewed by me and considered in my medical decision making (see chart for details). If possible, patient and/or family made aware of any abnormal findings.  No results found. ____________________________________________    PROCEDURES  Procedure(s) performed:  None  Procedures  Critical Care performed: None  ____________________________________________   INITIAL IMPRESSION / ASSESSMENT AND PLAN / ED COURSE  Pertinent labs & imaging results that were available during my care of the patient were reviewed by me and considered in my medical decision making (see chart for details).  With very atypical chest pain fleeting and sharp been there since yesterday, not pleuritic no shortness of breath no dyspnea no risk factors for PE low suspicion for PE somewhat reproducible discomfort, we will obtain cardiac enzymes, EKG is reassuring low suspicion for ACS, low suspicion for dissection low suspicion for myocarditis endocarditis pericarditis pneumonia pneumothorax.  Patient does have some reproducibility to this discomfort in the muscle skeletal region of the chest, he also has some epigastric discomfort and a history of "real bad reflux" we will give him a GI cocktail while this will not rule out cardiac disease, it might make him feel better.  Very reassuring work-up thus far.  Patient has been here for similar in the past.  ----------------------------------------- 1:10 PM on 11/11/2017 -----------------------------------------  I explained the patient this far his work-up is quite reassuring, I did suggest to him  that we would do a second set of cardiac enzymes and he refuses he states he has to get back to work.  Bilirubin is slightly up, we also discussed ultrasound and he declines at that as well.  Understands limitations that this places upon [place], he states he has to get back to work he feels fine and does not want to stay here anymore.  Risk benefits alternatives of leaving and staying all explained, and patient is adamant that he would like to leave.  He understands if he feels worse in any way he is welcome and encouraged to return to the emergency room.  We will give him instructions on close follow-up and return precautions were given and understood.   Patient I think has the capacity to make this decision and he absolutely refuses further stating he states he has to get to work    ____________________________________________   FINAL CLINICAL IMPRESSION(S) / ED DIAGNOSES  Final diagnoses:  None      This chart was dictated using voice recognition software.  Despite best efforts to proofread,  errors can occur which can change meaning.      Schuyler Amor, MD 11/11/17 1102    Schuyler Amor, MD 11/11/17 [id number]

## 2017-11-11 NOTE — Discharge Instructions (Addendum)
We have advised to stay in the hospital for further testing but you would prefer to go home.  This is certainly your  choice but limits her ability to further evaluate you.  For this reason we asked that you  be very vigilant about your health, if you have chest pain that gets worse or changes or is concerning to you or you change your mind about further work-up or you feel worse in any way including abdominal pain or vomiting please return to the emergency room.  Your bilirubin was slightly elevated, this could reflect a gallbladder problem please follow closely with your primary care doctor for recheck in a few days.  If you have severe pain or any other concerning symptoms please return to the emergency room

## 2017-11-11 NOTE — ED Notes (Signed)
Patient reports  Onset of r upper chest pain which just returned after some relief

## 2017-11-11 NOTE — ED Triage Notes (Signed)
CP to right side intermittent since yesterday. Pt reports constant CP today. Pt alert and oriented X4, active, cooperative, pt in NAD. RR even and unlabored, color WNL.

## 2018-04-07 ENCOUNTER — Ambulatory Visit
Admission: EM | Admit: 2018-04-07 | Discharge: 2018-04-07 | Disposition: A | Discharge status: Home / Self Care | Payer: BLUE CROSS/BLUE SHIELD | Attending: Family Medicine | Admitting: Family Medicine

## 2018-04-07 ENCOUNTER — Encounter: Payer: Self-pay | Admitting: Emergency Medicine

## 2018-04-07 ENCOUNTER — Other Ambulatory Visit: Payer: Self-pay

## 2018-04-07 ENCOUNTER — Ambulatory Visit (INDEPENDENT_AMBULATORY_CARE_PROVIDER_SITE_OTHER): Payer: BLUE CROSS/BLUE SHIELD

## 2018-04-07 DIAGNOSIS — J988 Other specified respiratory disorders: Secondary | ICD-10-CM

## 2018-04-07 DIAGNOSIS — R05 Cough: Secondary | ICD-10-CM | POA: Diagnosis not present

## 2018-04-07 DIAGNOSIS — R509 Fever, unspecified: Secondary | ICD-10-CM

## 2018-04-07 LAB — RAPID INFLUENZA A&B ANTIGENS: Influenza A (ARMC): NEGATIVE

## 2018-04-07 LAB — RAPID INFLUENZA A&B ANTIGENS (ARMC ONLY): INFLUENZA B (ARMC): NEGATIVE

## 2018-04-07 MED ORDER — HYDROCOD POLST-CPM POLST ER 10-8 MG/5ML PO SUER: 5.0000 mL | Freq: Two times a day (BID) | ORAL | 0 refills | Status: DC | PRN

## 2018-04-07 MED ORDER — DOXYCYCLINE HYCLATE 100 MG PO CAPS: 100.0000 mg | ORAL_CAPSULE | Freq: Two times a day (BID) | ORAL | 0 refills | Status: DC

## 2018-04-07 MED ORDER — OSELTAMIVIR PHOSPHATE 75 MG PO CAPS: 75.0000 mg | ORAL_CAPSULE | Freq: Two times a day (BID) | ORAL | 0 refills | Status: DC

## 2018-04-07 NOTE — Discharge Instructions (Signed)
Medications as prescribed. ° °Take care ° °Dr. Jordayn Mink  °

## 2018-04-07 NOTE — ED Provider Notes (Signed)
[facility]    CSN: [id number] Arrival date & time: 04/07/18  0802  History   Chief Complaint Chief Complaint  Patient presents with  . Cough   HPI  52 year old male presents with cough, chest congestion, and fever.  2 week history of cough and chest congestion. Cough is productive of discolored sputum. Wednesday developed fever (Tmax 102.5). Reports sweats at night. Has had to change clothes due to being drenched in sweat. Fever has persisted. He has been treating it with tylenol and ibuprofen with improvement (no resolution). Symptoms are severe. Sick contacts at work. No known exacerbating factors. No other associated symptoms. No other complaints.  History reviewed as below. Past Medical History:  Diagnosis Date  . Anxiety   . GERD (gastroesophageal reflux disease)   . Hypertension    Family History Family History  Problem Relation Age of Onset  . Cancer Mother   . Heart failure Father    Social History Social History   Tobacco Use  . Smoking status: Never Smoker  . Smokeless tobacco: Former Network engineer Use Topics  . Alcohol use: No  . Drug use: No   Allergies   Patient has no known allergies.   Review of Systems Review of Systems  Constitutional: Positive for fever.  Respiratory: Positive for cough.    Physical Exam Triage Vital Signs ED Triage Vitals  Enc Vitals Group     BP 04/07/18 0811 (!) 120/94     Pulse Rate 04/07/18 0811 (!) 107     Resp 04/07/18 0811 16     Temp 04/07/18 0811 99.3 F (37.4 C)     Temp Source 04/07/18 0811 Oral     SpO2 04/07/18 0811 96 %     Weight 04/07/18 0808 195 lb (88.5 kg)     Height 04/07/18 0808 5\' 11"  (1.803 m)     Head Circumference --      Peak Flow --      Pain Score 04/07/18 0808 4     Pain Loc --      Pain Edu? --      Excl. in [place]? --    Updated Vital Signs BP (!) 120/94 (BP Location: Left Arm)   Pulse (!) 107   Temp 99.3 F (37.4 C) (Oral)   Resp 16   Ht 5\' 11"  (1.803 m)   Wt  88.5 kg   SpO2 96%   BMI 27.20 kg/m   Visual Acuity Right Eye Distance:   Left Eye Distance:   Bilateral Distance:    Right Eye Near:   Left Eye Near:    Bilateral Near:     Physical Exam Vitals signs and nursing note reviewed.  Constitutional:      Appearance: Normal appearance. He is not ill-appearing.  HENT:     Head: Normocephalic and atraumatic.     Right Ear: Tympanic membrane normal.     Left Ear: Tympanic membrane normal.     Mouth/Throat:     Pharynx: Oropharynx is clear. No posterior oropharyngeal erythema.  Eyes:     General:        Right eye: No discharge.        Left eye: No discharge.     Conjunctiva/sclera: Conjunctivae normal.  Cardiovascular:     Rate and Rhythm: Regular rhythm. Tachycardia present.  Pulmonary:     Effort: Pulmonary effort is normal. No respiratory distress.     Breath sounds: No wheezing or rales.  Neurological:  Mental Status: He is alert.  Psychiatric:        Mood and Affect: Mood normal.        Behavior: Behavior normal.    UC Treatments / Results  Labs (all labs ordered are listed, but only abnormal results are displayed) Labs Reviewed  RAPID INFLUENZA A&B ANTIGENS ([place])    EKG None  Radiology Dg Chest 2 View  Result Date: 04/07/2018 CLINICAL DATA:  Cough and fever EXAM: CHEST - 2 VIEW COMPARISON:  November 11, 2017 FINDINGS: There is no edema or consolidation. The heart size and pulmonary vascularity are normal. No adenopathy. No bone lesions. IMPRESSION: No edema or consolidation. Electronically Signed   By: Lowella Grip III M.D.   On: 04/07/2018 08:37    Procedures Procedures (including critical care time)  Medications Ordered in UC Medications - No data to display  Initial Impression / Assessment and Plan / UC Course  I have reviewed the triage vital signs and the nursing notes.  Pertinent labs & imaging results that were available during my care of the patient were reviewed by me and considered in  my medical decision making (see chart for details).    52 year old male presents with a respiratory infection. Flu testing and chest xray were negative. I am still suspicious that he has influenza. Treating with tamiflu. Doxycycline to cover for bacterial bronchitis given history. Tussionex for cough.   Final Clinical Impressions(s) / UC Diagnoses   Final diagnoses:  Respiratory infection  Fever, unspecified fever cause     Discharge Instructions     Medications as prescribed.  Take care  Dr. Lacinda Axon     ED Prescriptions    Medication Sig Dispense Auth. Provider   oseltamivir (TAMIFLU) 75 MG capsule Take 1 capsule (75 mg total) by mouth every 12 (twelve) hours. 10 capsule Thersa Salt G, DO   doxycycline (VIBRAMYCIN) 100 MG capsule Take 1 capsule (100 mg total) by mouth 2 (two) times daily. 14 capsule [place], [place] G, DO   chlorpheniramine-HYDROcodone (TUSSIONEX PENNKINETIC ER) 10-8 MG/5ML SUER Take 5 mLs by mouth every 12 (twelve) hours as needed. 60 mL Coral Spikes, DO     Controlled Substance Prescriptions [place] Controlled Substance Registry consulted? Not Applicable   Coral Spikes, [place] 04/07/18 2595

## 2018-04-07 NOTE — ED Triage Notes (Signed)
Patient c/o cough and chest congestion for 2 weeks and reports fever Wed night.

## 2018-06-20 ENCOUNTER — Emergency Department: Payer: BLUE CROSS/BLUE SHIELD

## 2018-06-20 ENCOUNTER — Emergency Department
Admission: EM | Admit: 2018-06-20 | Discharge: 2018-06-20 | Disposition: A | Discharge status: Home / Self Care | Payer: BLUE CROSS/BLUE SHIELD | Attending: Emergency Medicine | Admitting: Emergency Medicine

## 2018-06-20 ENCOUNTER — Other Ambulatory Visit: Payer: Self-pay

## 2018-06-20 ENCOUNTER — Encounter: Payer: Self-pay | Admitting: Emergency Medicine

## 2018-06-20 DIAGNOSIS — I10 Essential (primary) hypertension: Secondary | ICD-10-CM | POA: Diagnosis not present

## 2018-06-20 DIAGNOSIS — K2920 Alcoholic gastritis without bleeding: Secondary | ICD-10-CM | POA: Diagnosis not present

## 2018-06-20 DIAGNOSIS — R1010 Upper abdominal pain, unspecified: Secondary | ICD-10-CM | POA: Diagnosis present

## 2018-06-20 DIAGNOSIS — E279 Disorder of adrenal gland, unspecified: Secondary | ICD-10-CM | POA: Diagnosis not present

## 2018-06-20 DIAGNOSIS — Z79899 Other long term (current) drug therapy: Secondary | ICD-10-CM | POA: Diagnosis not present

## 2018-06-20 LAB — COMPREHENSIVE METABOLIC PANEL
ALT: 84 U/L — ABNORMAL HIGH (ref 0–44)
AST: 83 U/L — ABNORMAL HIGH (ref 15–41)
Albumin: 4.6 g/dL (ref 3.5–5.0)
Alkaline Phosphatase: 65 U/L (ref 38–126)
Anion gap: 16 — ABNORMAL HIGH (ref 5–15)
BUN: 13 mg/dL (ref 6–20)
CO2: 19 mmol/L — ABNORMAL LOW (ref 22–32)
Calcium: 9.5 mg/dL (ref 8.9–10.3)
Chloride: 100 mmol/L (ref 98–111)
Creatinine, Ser: 0.91 mg/dL (ref 0.61–1.24)
GFR calc Af Amer: >60 mL/min (ref >60)
GFR calc non Af Amer: >60 mL/min (ref >60)
GLUCOSE: 113 mg/dL — AB (ref 70–99)
Potassium: 3.9 mmol/L (ref 3.5–5.1)
Sodium: 135 mmol/L (ref 135–145)
TOTAL PROTEIN: 8.1 g/dL (ref 6.5–8.1)
Total Bilirubin: 1.6 mg/dL — ABNORMAL HIGH (ref 0.3–1.2)

## 2018-06-20 LAB — URINALYSIS, COMPLETE (UACMP) WITH MICROSCOPIC
BILIRUBIN URINE: NEGATIVE
Bacteria, UA: NONE SEEN
Glucose, UA: NEGATIVE mg/dL
Hgb urine dipstick: NEGATIVE
Ketones, ur: NEGATIVE mg/dL
Leukocytes,Ua: NEGATIVE
Nitrite: NEGATIVE
Protein, ur: NEGATIVE mg/dL
Specific Gravity, Urine: 1.015 (ref 1.005–1.030)
pH: 5 (ref 5.0–8.0)

## 2018-06-20 LAB — CBC
HCT: 43.7 % (ref 39.0–52.0)
Hemoglobin: 15.6 g/dL (ref 13.0–17.0)
MCH: 34.5 pg — ABNORMAL HIGH (ref 26.0–34.0)
MCHC: 35.7 g/dL (ref 30.0–36.0)
MCV: 96.7 fL (ref 80.0–100.0)
Platelets: 291 10*3/uL (ref 150–400)
RBC: 4.52 MIL/uL (ref 4.22–5.81)
RDW: 11.5 % (ref 11.5–15.5)
WBC: 6.5 10*3/uL (ref 4.0–10.5)
nRBC: 0 % (ref 0.0–0.2)

## 2018-06-20 LAB — LIPASE, BLOOD: Lipase: 57 U/L — ABNORMAL HIGH (ref 11–51)

## 2018-06-20 LAB — LACTIC ACID, PLASMA: Lactic Acid, Venous: 1.5 mmol/L (ref 0.5–1.9)

## 2018-06-20 LAB — CK: Total CK: 257 U/L (ref 49–397)

## 2018-06-20 MED ORDER — SODIUM CHLORIDE 0.9% FLUSH: 3.0000 mL | Freq: Once | INTRAVENOUS | Status: DC

## 2018-06-20 MED ORDER — IOHEXOL 300 MG/ML  SOLN
100.0000 mL | Freq: Once | INTRAMUSCULAR | Status: AC | PRN
  Administered 2018-06-20: 100 mL via INTRAVENOUS
  Filled 2018-06-20: qty 100

## 2018-06-20 MED ORDER — SODIUM CHLORIDE 0.9 % IV BOLUS
1000.0000 mL | Freq: Once | INTRAVENOUS | Status: AC
  Administered 2018-06-20: 1000 mL via INTRAVENOUS

## 2018-06-20 MED ORDER — IOPAMIDOL (ISOVUE-300) INJECTION 61%
30.0000 mL | Freq: Once | INTRAVENOUS | Status: AC | PRN
  Administered 2018-06-20: 30 mL via ORAL
  Filled 2018-06-20: qty 30

## 2018-06-20 MED ORDER — ONDANSETRON 4 MG PO TBDP: 4.0000 mg | ORAL_TABLET | Freq: Four times a day (QID) | ORAL | 0 refills | Status: DC | PRN

## 2018-06-20 NOTE — ED Notes (Signed)
Pt finished with oral contrast, CT notified  

## 2018-06-20 NOTE — ED Notes (Signed)
Pt c/o epigastric pain that radiates into the LUQ , seems to be worse after eating. Denies having any similar sx in the past. Denies N/V/D.Marland Kitchen pt states he drinks a few beers in the evening.

## 2018-06-20 NOTE — Discharge Instructions (Signed)
Please stop alcohol use, this may be the cause of your symptoms.  Please return to the emergency room right away if you are to develop a fever, severe nausea, your pain becomes severe or worsens, you are unable to keep food down, begin vomiting any dark or bloody fluid, you develop any dark or bloody stools, feel dehydrated, or other new concerns or symptoms arise.

## 2018-06-20 NOTE — ED Triage Notes (Addendum)
LUQ abd x2 days on and off, worse and constant today.  Last BM this AM.  Increased pain on palpation

## 2018-06-20 NOTE — ED Provider Notes (Signed)
[facility] Emergency Department Provider Note   ____________________________________________   First MD Initiated Contact with Patient 06/20/18 1156     (approximate)  I have reviewed the triage vital signs and the nursing notes.   HISTORY  Chief Complaint Abdominal Pain    HPI Patrick Barajas. is a 53 y.o. male here for evaluation of left upper abdominal pain  Patient reports about 2 days ago he started experiencing a little bit of an on and off slight discomfort in the left upper abdomen.  Today he got up this morning, he ate chicken and began experiencing a pretty severe left upper abdominal pain.  It was associated with a little bit of nausea but no vomiting.  No chest pain.  He did have 2 stools today which she reports is unusual, the first 1 seems just slightly watery.  No black or bloody stool.  He does relate a history of alcohol use drinks 5 to 6 cans of beer a day but also reports that he eats 3 normal meals daily as well.  No fevers or chills.  No chest pain no trouble breathing.  Does not feel lightheaded.  At present he reports the pain and nausea seem to gone away completely.  He has had evaluation for ulcers in the past, but reports of none were ever seen.  Does continue take omeprazole daily and does not take any blood thinners   Past Medical History:  Diagnosis Date  . Anxiety   . GERD (gastroesophageal reflux disease)   . Hypertension     There are no active problems to display for this patient.   History reviewed. No pertinent surgical history.  Prior to Admission medications   Medication Sig Start Date End Date Taking? Authorizing Provider  lisinopril-hydrochlorothiazide (PRINZIDE,ZESTORETIC) 10-12.5 MG tablet Take 1 tablet by mouth daily. 02/10/18  Yes [provider]  simvastatin (ZOCOR) 20 MG tablet Take 20 mg by mouth Nightly. 09/23/17  Yes [provider]  chlorpheniramine-HYDROcodone (TUSSIONEX  PENNKINETIC ER) 10-8 MG/5ML SUER Take 5 mLs by mouth every 12 (twelve) hours as needed. 04/07/18   Coral Spikes, DO  citalopram (CELEXA) 20 MG tablet Take 1 tablet by mouth daily. 05/30/17   [provider]  doxycycline (VIBRAMYCIN) 100 MG capsule Take 1 capsule (100 mg total) by mouth 2 (two) times daily. 04/07/18   Coral Spikes, DO  lisinopril-hydrochlorothiazide (PRINZIDE,ZESTORETIC) 20-25 MG per tablet Take 1 tablet by mouth daily.    [provider]  Multiple Vitamins-Minerals (MULTIVITAMIN ADULT) TABS Take 1 tablet by mouth daily.    [provider]  Omega-3 1000 MG CAPS Take 1 capsule by mouth 2 (two) times daily.    [provider]  omeprazole (PRILOSEC) 40 MG capsule Take 40 mg by mouth daily.    [provider]  ondansetron (ZOFRAN ODT) 4 MG disintegrating tablet Take 1 tablet (4 mg total) by mouth every 6 (six) hours as needed for nausea or vomiting. 06/20/18   Delman Kitten, MD  oseltamivir (TAMIFLU) 75 MG capsule Take 1 capsule (75 mg total) by mouth every 12 (twelve) hours. 04/07/18   Coral Spikes, DO    Allergies Patient has no known allergies.  Family History  Problem Relation Age of Onset  . Cancer Mother   . Heart failure Father     Social History Social History   Tobacco Use  . Smoking status: Never Smoker  . Smokeless tobacco: Former Network engineer Use Topics  .  Alcohol use: No  . Drug use: No    Review of Systems Constitutional: No fever/chills Eyes: No visual changes. ENT: No sore throat. Cardiovascular: Denies chest pain. Respiratory: Denies shortness of breath. Gastrointestinal: See HPI Genitourinary: Negative for dysuria. Musculoskeletal: Negative for back pain. Skin: Negative for rash. Neurological: Negative for headaches, areas of focal weakness or numbness.    ____________________________________________   PHYSICAL EXAM:  VITAL SIGNS: ED Triage Vitals [06/20/18 1027]  Enc Vitals Group     BP  (!) 169/91     Pulse Rate 94     Resp 18     Temp 99 F (37.2 C)     Temp Source Oral     SpO2 98 %     Weight 195 lb (88.5 kg)     Height 5\' 11"  (1.803 m)     Head Circumference      Peak Flow      Pain Score 9     Pain Loc      Pain Edu?      Excl. in [place]?     Constitutional: Alert and oriented. Well appearing and in no acute distress. Eyes: Conjunctivae are normal. Head: Atraumatic. Nose: No congestion/rhinnorhea. Mouth/Throat: Mucous membranes are moist. Neck: No stridor.  Cardiovascular: Normal rate, regular rhythm. Grossly normal heart sounds.  Good peripheral circulation. Respiratory: Normal respiratory effort.  No retractions. Lungs CTAB. Gastrointestinal: Soft and nontender except he does complain of some slight discomfort in the epigastrium and a mild reproducible discomfort in the left upper quadrant without rebound or guarding. No distention. Musculoskeletal: No lower extremity tenderness nor edema. Neurologic:  Normal speech and language. No gross focal neurologic deficits are appreciated.  Skin:  Skin is warm, dry and intact. No rash noted. Psychiatric: Mood and affect are normal. Speech and behavior are normal.  ____________________________________________   LABS (all labs ordered are listed, but only abnormal results are displayed)  Labs Reviewed  LIPASE, BLOOD - Abnormal; Notable for the following components:      Result Value   Lipase 57 (*)    All other components within normal limits  COMPREHENSIVE METABOLIC PANEL - Abnormal; Notable for the following components:   CO2 19 (*)    Glucose, Bld 113 (*)    AST 83 (*)    ALT 84 (*)    Total Bilirubin 1.6 (*)    Anion gap 16 (*)    All other components within normal limits  CBC - Abnormal; Notable for the following components:   MCH 34.5 (*)    All other components within normal limits  URINALYSIS, COMPLETE (UACMP) WITH MICROSCOPIC - Abnormal; Notable for the following components:   Color, Urine  YELLOW (*)    APPearance CLEAR (*)    All other components within normal limits  CK  LACTIC ACID, PLASMA   ____________________________________________  EKG  Reviewed by me at 10:40 AM Heart rate 80 QRS 90 QTc 440 Normal sinus rhythm, possible old inferior infarct.  No evidence of acute ischemia ____________________________________________  RADIOLOGY  Ct Abdomen Pelvis W Contrast  Result Date: 06/20/2018 CLINICAL DATA:  Left sided abdominal pain EXAM: CT ABDOMEN AND PELVIS WITH CONTRAST TECHNIQUE: Multidetector CT imaging of the abdomen and pelvis was performed using the standard protocol following bolus administration of intravenous contrast. Oral contrast was also administered. CONTRAST:  128mL OMNIPAQUE IOHEXOL 300 MG/ML  SOLN COMPARISON:  November 29, 2011 FINDINGS: Lower chest: Lung bases are clear. Hepatobiliary: There is hepatic steatosis. No focal liver lesions  are apparent. Gallbladder wall is not appreciably thickened. There is no biliary duct dilatation. Pancreas: No pancreatic mass or inflammatory focus. Spleen: No splenic lesions are evident. Adrenals/Urinary Tract: Right adrenal appears normal. 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 Kidneys bilaterally show no evident mass or hydronephrosis. There is no evident renal or ureteral calculus on either side. Urinary bladder is midline with wall thickness within normal limits. Stomach/Bowel: There is no appreciable bowel wall or mesenteric thickening. There is no evident bowel obstruction. There is no free air or portal venous air. Vascular/Lymphatic: There is aortic atherosclerosis. No aneurysm evident. Major mesenteric arterial vessels are patent. There are again noted varices along the anterior pelvic wall. There is no adenopathy in the abdomen or pelvis. Reproductive: Prostate and seminal vesicles are normal in size and contour. There is no evident pelvic mass. Other: Appendix  appears normal. There is no abscess or ascites in the abdomen or pelvis. Musculoskeletal: There are no blastic or lytic bone lesions. There is no intramuscular or abdominal wall lesion. IMPRESSION: 1. No evident bowel obstruction. No abscess in the abdomen or pelvis. Appendix appears normal. 2.  No appreciable renal or ureteral calculus.  No hydronephrosis. 3. 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 4. 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 5.  Hepatic steatosis. 6.  Aortic atherosclerosis. Electronically Signed   By: Lowella Grip III M.D.   On: 06/20/2018 13:44     The scan results reviewed.  No acute findings. ____________________________________________   PROCEDURES  Procedure(s) performed: None  Procedures  Critical Care performed: No  ____________________________________________   INITIAL IMPRESSION / ASSESSMENT AND PLAN / ED COURSE  Pertinent labs & imaging results that were available during my care of the patient were reviewed by me and considered in my medical decision making (see chart for details).   Differential diagnosis includes but is not limited to, abdominal perforation, aortic dissection, cholecystitis, appendicitis, diverticulitis, colitis, esophagitis/gastritis, kidney stone, pyelonephritis, urinary tract infection, aortic aneurysm. All are considered in decision and treatment plan. Based upon the patient's presentation and risk factors, I think we will proceed with CT scan.  He also has slightly low bicarbonate and a slightly elevated anion gap acidosis.  He is a regular alcohol drinker, but drinks only in the evening and reports he is eating meals normally which makes me think this is not associated with an alcoholic ketoacidosis.  We will proceed with CT to evaluate for cholelithiasis, choledocholithiasis, gastric inflammation, diverticulitis etc.  Denies any black or bloody stools.  Not anticoagulated.   Normal hemoglobin and normal white count.  Urinalysis negative, negative for ketones.     Clinical Course as of Jun 20 1357  Tue Jun 20, 2018  1302 CK normal.  Lactic acid normal.  Suspect likely some element of dehydration driving the anion gap.  Will hydrate generously here.   [MQ]    Clinical Course User Index [MQ] Delman Kitten, MD   ----------------------------------------- 1:59 PM on 06/20/2018 -----------------------------------------  Patient feels much better.  Resting comfortably.  Tolerated contrast well without any emesis.  Reports he feels improved.  Reviewed the CT findings and his history, also minimally elevated LFTs I suspect this may be some type of gastritis and I discussed with him as he reports that he used to be a heavy drinker, and he is now a drinker of about a sixpack each evening and discontinuation of alcohol.  Patient is agreeable.  We will follow-up closely with his primary care doctor and gave referral information for GI clinic as well for follow-up which I advised him to do.  Return precautions and treatment recommendations and follow-up discussed with the patient who is agreeable with the plan.   ____________________________________________   FINAL CLINICAL IMPRESSION(S) / ED DIAGNOSES  Final diagnoses:  Acute alcoholic gastritis without hemorrhage        Note:  This document was prepared using Dragon voice recognition software and may include unintentional dictation errors       Delman Kitten, MD 06/20/18 1400

## 2018-06-20 NOTE — ED Notes (Addendum)
Patient transported to CT 

## 2018-06-20 NOTE — ED Notes (Signed)
Pt ambulatory to bathroom independently

## 2018-06-27 DIAGNOSIS — I10 Essential (primary) hypertension: Secondary | ICD-10-CM | POA: Diagnosis not present

## 2018-06-27 DIAGNOSIS — F418 Other specified anxiety disorders: Secondary | ICD-10-CM | POA: Diagnosis not present

## 2018-06-27 DIAGNOSIS — K219 Gastro-esophageal reflux disease without esophagitis: Secondary | ICD-10-CM | POA: Diagnosis not present

## 2018-06-27 DIAGNOSIS — E782 Mixed hyperlipidemia: Secondary | ICD-10-CM | POA: Diagnosis not present

## 2018-08-26 ENCOUNTER — Other Ambulatory Visit: Payer: Self-pay

## 2018-08-26 ENCOUNTER — Emergency Department
Admission: EM | Admit: 2018-08-26 | Discharge: 2018-08-26 | Disposition: A | Discharge status: Home / Self Care | Payer: BLUE CROSS/BLUE SHIELD | Attending: Emergency Medicine | Admitting: Emergency Medicine

## 2018-08-26 ENCOUNTER — Emergency Department: Payer: BLUE CROSS/BLUE SHIELD

## 2018-08-26 DIAGNOSIS — I10 Essential (primary) hypertension: Secondary | ICD-10-CM | POA: Insufficient documentation

## 2018-08-26 DIAGNOSIS — F419 Anxiety disorder, unspecified: Secondary | ICD-10-CM | POA: Insufficient documentation

## 2018-08-26 DIAGNOSIS — R52 Pain, unspecified: Secondary | ICD-10-CM | POA: Diagnosis not present

## 2018-08-26 DIAGNOSIS — R1011 Right upper quadrant pain: Secondary | ICD-10-CM | POA: Diagnosis not present

## 2018-08-26 DIAGNOSIS — K760 Fatty (change of) liver, not elsewhere classified: Secondary | ICD-10-CM | POA: Diagnosis not present

## 2018-08-26 DIAGNOSIS — R61 Generalized hyperhidrosis: Secondary | ICD-10-CM | POA: Diagnosis not present

## 2018-08-26 DIAGNOSIS — R109 Unspecified abdominal pain: Secondary | ICD-10-CM | POA: Diagnosis not present

## 2018-08-26 DIAGNOSIS — Z79899 Other long term (current) drug therapy: Secondary | ICD-10-CM | POA: Insufficient documentation

## 2018-08-26 DIAGNOSIS — R079 Chest pain, unspecified: Secondary | ICD-10-CM

## 2018-08-26 DIAGNOSIS — R531 Weakness: Secondary | ICD-10-CM | POA: Diagnosis not present

## 2018-08-26 LAB — COMPREHENSIVE METABOLIC PANEL
ALT: 70 U/L — ABNORMAL HIGH (ref 0–44)
AST: 55 U/L — ABNORMAL HIGH (ref 15–41)
Albumin: 4.4 g/dL (ref 3.5–5.0)
Alkaline Phosphatase: 64 U/L (ref 38–126)
Anion gap: 11 (ref 5–15)
BUN: 9 mg/dL (ref 6–20)
CO2: 26 mmol/L (ref 22–32)
Calcium: 9.4 mg/dL (ref 8.9–10.3)
Chloride: 99 mmol/L (ref 98–111)
Creatinine, Ser: 0.87 mg/dL (ref 0.61–1.24)
GFR calc Af Amer: >60 mL/min (ref >60)
GFR calc non Af Amer: >60 mL/min (ref >60)
Glucose, Bld: 122 mg/dL — ABNORMAL HIGH (ref 70–99)
Potassium: 3.5 mmol/L (ref 3.5–5.1)
Sodium: 136 mmol/L (ref 135–145)
Total Bilirubin: 1.6 mg/dL — ABNORMAL HIGH (ref 0.3–1.2)
Total Protein: 8.1 g/dL (ref 6.5–8.1)

## 2018-08-26 LAB — CBC
HCT: 43.3 % (ref 39.0–52.0)
Hemoglobin: 15.3 g/dL (ref 13.0–17.0)
MCH: 34.9 pg — ABNORMAL HIGH (ref 26.0–34.0)
MCHC: 35.3 g/dL (ref 30.0–36.0)
MCV: 98.6 fL (ref 80.0–100.0)
Platelets: 306 10*3/uL (ref 150–400)
RBC: 4.39 MIL/uL (ref 4.22–5.81)
RDW: 11.4 % — ABNORMAL LOW (ref 11.5–15.5)
WBC: 8.7 10*3/uL (ref 4.0–10.5)
nRBC: 0 % (ref 0.0–0.2)

## 2018-08-26 LAB — TROPONIN I
Troponin I: <0.03 ng/mL (ref <0.03)
Troponin I: <0.03 ng/mL (ref <0.03)

## 2018-08-26 LAB — LIPASE, BLOOD: Lipase: 63 U/L — ABNORMAL HIGH (ref 11–51)

## 2018-08-26 MED ORDER — ASPIRIN 81 MG PO CHEW
324.0000 mg | CHEWABLE_TABLET | Freq: Once | ORAL | Status: AC
  Administered 2018-08-26: 324 mg via ORAL
  Filled 2018-08-26: qty 4

## 2018-08-26 NOTE — Discharge Instructions (Addendum)

## 2018-08-26 NOTE — ED Triage Notes (Signed)
Pt comes EMS after having 2 episodes of abdominal pain, sweating, weakness, and increased BP. Pt EKG with EMS had some inverted t waves. Episodes happened once while exerting himself and the other while at rest. Pt endorses panic attacks after his mom has died. Pt speech is very pressured and fearful.

## 2018-08-26 NOTE — ED Provider Notes (Signed)
[facility] Emergency Department Provider Note  ____________________________________________  Time seen: Approximately 7:40 PM  I have reviewed the triage vital signs and the nursing notes.   HISTORY  Chief Complaint Abdominal Pain and Panic Attack   HPI Patrick Barajas. is a 53 y.o. male with history of anxiety, GERD, hypertension who presents for evaluation of abdominal pain.  Patient reports having an episode yesterday and 1 today.  He is concerned about his heart because his father has had a heart attack in the past.  Patient reports that yesterday he was at work.  He was lifting some heavy stuff at work when he broke out in a very bad sweat.  He denies having any dizziness, chest pain, shortness of breath, or abdominal pain.  Only had the sweat.  He asked his boss to go home.  When he got home he started having left-sided abdominal pain that he describes as a stabbing pain.  Throughout the rest of the day he felt all right.  Today he spent most of the day in bed and did not go to work.  He went outside with his son and after walking for a little bit he again broke out in a bad sweat.  After that he developed again the stabbing pain in the left side of his abdomen.  Never had any chest pain, dizziness or shortness of breath.  No pain at this time.  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  Patient denies history of smoking, has not had any alcohol for 2 weeks, denies drug use.  Past Medical History:  Diagnosis Date  . Anxiety   . GERD (gastroesophageal reflux disease)   . Hypertension     Prior to Admission medications   Medication Sig Start Date End Date Taking? Authorizing Provider  chlorpheniramine-HYDROcodone (TUSSIONEX PENNKINETIC ER) 10-8 MG/5ML SUER Take 5 mLs by mouth every 12 (twelve) hours as needed. 04/07/18    Coral Spikes, DO  citalopram (CELEXA) 20 MG tablet Take 1 tablet by mouth daily. 05/30/17   [provider]  doxycycline (VIBRAMYCIN) 100 MG capsule Take 1 capsule (100 mg total) by mouth 2 (two) times daily. 04/07/18   Coral Spikes, DO  lisinopril-hydrochlorothiazide (PRINZIDE,ZESTORETIC) 10-12.5 MG tablet Take 1 tablet by mouth daily. 02/10/18   [provider]  lisinopril-hydrochlorothiazide (PRINZIDE,ZESTORETIC) 20-25 MG per tablet Take 1 tablet by mouth daily.    [provider]  Multiple Vitamins-Minerals (MULTIVITAMIN ADULT) TABS Take 1 tablet by mouth daily.    [provider]  Omega-3 1000 MG CAPS Take 1 capsule by mouth 2 (two) times daily.    [provider]  omeprazole (PRILOSEC) 40 MG capsule Take 40 mg by mouth daily.    [provider]  ondansetron (ZOFRAN ODT) 4 MG disintegrating tablet Take 1 tablet (4 mg total) by mouth every 6 (six) hours as needed for nausea or vomiting. 06/20/18   Delman Kitten, MD  oseltamivir (TAMIFLU) 75 MG capsule Take 1 capsule (75 mg total) by mouth every 12 (twelve) hours. 04/07/18   Coral Spikes, DO  simvastatin (ZOCOR) 20 MG tablet Take 20 mg by mouth Nightly. 09/23/17   [provider]    Allergies Patient has no known allergies.  Family History  Problem Relation Age of Onset  . Cancer Mother   .  Heart failure Father     Social History Social History   Tobacco Use  . Smoking status: Never Smoker  . Smokeless tobacco: Former Network engineer Use Topics  . Alcohol use: No  . Drug use: No    Review of Systems  Constitutional: Negative for fever. + sweating Eyes: Negative for visual changes. ENT: Negative for sore throat. Neck: No neck pain  Cardiovascular: Negative for chest pain. Respiratory: Negative for shortness of breath. Gastrointestinal: + Left side abdominal pain. No vomiting or diarrhea. Genitourinary: Negative for dysuria. Musculoskeletal: Negative for back  pain. Skin: Negative for rash. Neurological: Negative for headaches, weakness or numbness. Psych: No SI or HI  ____________________________________________   PHYSICAL EXAM:  VITAL SIGNS: ED Triage Vitals  Enc Vitals Group     BP 08/26/18 1855 (!) 145/98     Pulse Rate 08/26/18 1855 73     Resp 08/26/18 1855 15     Temp 08/26/18 1855 98.7 F (37.1 C)     Temp Source 08/26/18 1855 Oral     SpO2 08/26/18 1855 98 %     Weight 08/26/18 1856 180 lb (81.6 kg)     Height 08/26/18 1856 5\' 11"  (1.803 m)     Head Circumference --      Peak Flow --      Pain Score 08/26/18 1855 1     Pain Loc --      Pain Edu? --      Excl. in [place]? --     Constitutional: Alert and oriented. Well appearing and in no apparent distress. HEENT:      Head: Normocephalic and atraumatic.         Eyes: Conjunctivae are normal. Sclera is non-icteric.       Mouth/Throat: Mucous membranes are moist.       Neck: Supple with no signs of meningismus. Cardiovascular: Regular rate and rhythm. No murmurs, gallops, or rubs. 2+ symmetrical distal pulses are present in all extremities. No JVD. Respiratory: Normal respiratory effort. Lungs are clear to auscultation bilaterally. No wheezes, crackles, or rhonchi.  Gastrointestinal: Soft, non tender, and non distended with positive bowel sounds. No rebound or guarding. Musculoskeletal: Nontender with normal range of motion in all extremities. No edema, cyanosis, or erythema of extremities. Neurologic: Normal speech and language. Face is symmetric. Moving all extremities. No gross focal neurologic deficits are appreciated. Skin: Skin is warm, dry and intact. No rash noted. Psychiatric: Mood and affect are normal. Speech and behavior are normal.  ____________________________________________   LABS (all labs ordered are listed, but only abnormal results are displayed)  Labs Reviewed  LIPASE, BLOOD - Abnormal; Notable for the following components:      Result Value    Lipase 63 (*)    All other components within normal limits  COMPREHENSIVE METABOLIC PANEL - Abnormal; Notable for the following components:   Glucose, Bld 122 (*)    AST 55 (*)    ALT 70 (*)    Total Bilirubin 1.6 (*)    All other components within normal limits  CBC - Abnormal; Notable for the following components:   MCH 34.9 (*)    RDW 11.4 (*)    All other components within normal limits  TROPONIN I  TROPONIN I  URINALYSIS, COMPLETE (UACMP) WITH MICROSCOPIC   ____________________________________________  EKG  ED ECG REPORT I, Rudene Re, the attending physician, personally viewed and interpreted this ECG.  Normal sinus rhythm, rate of 65, normal intervals, left axis deviation,  no ST elevations or depressions.  Unchanged from prior. ____________________________________________  RADIOLOGY  I have personally reviewed the images performed during this visit and I agree with the Radiologist's read.   Interpretation by Radiologist:  Dg Chest Port 1 View  Result Date: 08/26/2018 CLINICAL DATA:  Abdominal pain. EXAM: PORTABLE CHEST 1 VIEW COMPARISON:  None. FINDINGS: The heart size and mediastinal contours are within normal limits. Both lungs are clear. The visualized skeletal structures are unremarkable. IMPRESSION: No active disease. Electronically Signed   By: Dorise Bullion III M.D   On: 08/26/2018 20:33   US Abdomen Limited Ruq  Result Date: 08/26/2018 CLINICAL DATA:  Intermittent abdominal pain, elevated lipase, history hypertension, GERD EXAM: ULTRASOUND ABDOMEN LIMITED RIGHT UPPER QUADRANT COMPARISON:  CT abdomen and pelvis 06/20/2018 FINDINGS: Gallbladder: Normally distended without stones or wall thickening. No pericholecystic fluid or sonographic Murphy sign. Common bile duct: Diameter: 5 mm diameter, normal Liver: Echogenic liver with smooth hepatic margins consistent with fatty infiltration, also noted on prior CT. No hepatic mass or nodularity. Portal vein is  patent on color Doppler imaging with normal direction of blood flow towards the liver. No RIGHT upper quadrant free fluid. IMPRESSION: Fatty infiltration of liver. Otherwise normal exam. Electronically Signed   By: Lavonia Dana M.D.   On: 08/26/2018 20:58      ____________________________________________   PROCEDURES  Procedure(s) performed: None Procedures Critical Care performed:  None ____________________________________________   INITIAL IMPRESSION / ASSESSMENT AND PLAN / ED COURSE  53 y.o. male with history of anxiety, GERD, hypertension who presents for evaluation of 2 episodes of excessive sweating followed by left sided abdominal pain.  At this time patient is asymptomatic, normal vitals, abdomen soft with no tenderness, heart regular rate and rhythm.  EKG shows no acute ischemic changes.  Low risk per heart score.  Will further stratified by getting troponin x2.  Patient with CT in February showing benign appearing left adrenal gland mass which could be causing patient's episodes of pain.  Will need follow-up with PCP for further evaluation of such.    _________________________ 10:35 PM on 08/26/2018 ----------------------------------------- Initial labs showing mildly elevated lipase, LFTs and T bili.  Patient was sent for a right upper quadrant ultrasound to rule out biliary colic as possible etiology of his pain.  Ultrasound was negative.  Troponin x2-.  Patient with no further episodes of pain or diaphoresis.  Patient was referred back to his primary care doctor for further evaluation.  Standard return precautions were discussed.    As part of my medical decision making, I reviewed the following data within the [place] notes reviewed and incorporated, Labs reviewed , EKG interpreted , Old EKG reviewed, Old chart reviewed, Radiograph reviewed , Notes from prior ED visits and [place] Controlled Substance Database    Pertinent labs & imaging results that  were available during my care of the patient were reviewed by me and considered in my medical decision making (see chart for details).    ____________________________________________   FINAL CLINICAL IMPRESSION(S) / ED DIAGNOSES  Final diagnoses:  Abdominal pain      NEW MEDICATIONS STARTED DURING THIS VISIT:  ED Discharge Orders    None       Note:  This document was prepared using Dragon voice recognition software and may include unintentional dictation errors.    Alfred Levins, [place], MD 08/26/18 2236

## 2018-08-26 NOTE — ED Notes (Signed)
Patient transported to Ultrasound 

## 2018-09-06 DIAGNOSIS — R7989 Other specified abnormal findings of blood chemistry: Secondary | ICD-10-CM | POA: Diagnosis not present

## 2018-09-06 DIAGNOSIS — F1010 Alcohol abuse, uncomplicated: Secondary | ICD-10-CM | POA: Diagnosis not present

## 2018-09-06 DIAGNOSIS — Z1211 Encounter for screening for malignant neoplasm of colon: Secondary | ICD-10-CM | POA: Diagnosis not present

## 2018-09-06 DIAGNOSIS — R1012 Left upper quadrant pain: Secondary | ICD-10-CM | POA: Diagnosis not present

## 2018-09-14 ENCOUNTER — Emergency Department: Payer: BLUE CROSS/BLUE SHIELD

## 2018-09-14 ENCOUNTER — Emergency Department
Admission: EM | Admit: 2018-09-14 | Discharge: 2018-09-14 | Disposition: A | Discharge status: Home / Self Care | Payer: BLUE CROSS/BLUE SHIELD | Attending: Emergency Medicine | Admitting: Emergency Medicine

## 2018-09-14 ENCOUNTER — Other Ambulatory Visit: Payer: Self-pay

## 2018-09-14 DIAGNOSIS — R55 Syncope and collapse: Secondary | ICD-10-CM | POA: Insufficient documentation

## 2018-09-14 DIAGNOSIS — R1084 Generalized abdominal pain: Secondary | ICD-10-CM | POA: Diagnosis not present

## 2018-09-14 DIAGNOSIS — I10 Essential (primary) hypertension: Secondary | ICD-10-CM | POA: Insufficient documentation

## 2018-09-14 DIAGNOSIS — Z87891 Personal history of nicotine dependence: Secondary | ICD-10-CM | POA: Insufficient documentation

## 2018-09-14 DIAGNOSIS — R1012 Left upper quadrant pain: Secondary | ICD-10-CM | POA: Diagnosis not present

## 2018-09-14 DIAGNOSIS — Z79899 Other long term (current) drug therapy: Secondary | ICD-10-CM | POA: Diagnosis not present

## 2018-09-14 DIAGNOSIS — R52 Pain, unspecified: Secondary | ICD-10-CM | POA: Diagnosis not present

## 2018-09-14 LAB — COMPREHENSIVE METABOLIC PANEL
ALT: 96 U/L — ABNORMAL HIGH (ref 0–44)
AST: 117 U/L — ABNORMAL HIGH (ref 15–41)
Albumin: 4.4 g/dL (ref 3.5–5.0)
Alkaline Phosphatase: 63 U/L (ref 38–126)
Anion gap: 14 (ref 5–15)
BUN: 6 mg/dL (ref 6–20)
CO2: 23 mmol/L (ref 22–32)
Calcium: 9.1 mg/dL (ref 8.9–10.3)
Chloride: 90 mmol/L — ABNORMAL LOW (ref 98–111)
Creatinine, Ser: 0.77 mg/dL (ref 0.61–1.24)
GFR calc Af Amer: >60 mL/min (ref >60)
GFR calc non Af Amer: >60 mL/min (ref >60)
Glucose, Bld: 175 mg/dL — ABNORMAL HIGH (ref 70–99)
Potassium: 3.5 mmol/L (ref 3.5–5.1)
Sodium: 127 mmol/L — ABNORMAL LOW (ref 135–145)
Total Bilirubin: 2 mg/dL — ABNORMAL HIGH (ref 0.3–1.2)
Total Protein: 8.3 g/dL — ABNORMAL HIGH (ref 6.5–8.1)

## 2018-09-14 LAB — CBC WITH DIFFERENTIAL/PLATELET
Abs Immature Granulocytes: 0.01 10*3/uL (ref 0.00–0.07)
Basophils Absolute: 0 10*3/uL (ref 0.0–0.1)
Basophils Relative: 0 %
Eosinophils Absolute: 0 10*3/uL (ref 0.0–0.5)
Eosinophils Relative: 1 %
HCT: 43.8 % (ref 39.0–52.0)
Hemoglobin: 16.1 g/dL (ref 13.0–17.0)
Immature Granulocytes: 0 %
Lymphocytes Relative: 20 %
Lymphs Abs: 1 10*3/uL (ref 0.7–4.0)
MCH: 34.3 pg — ABNORMAL HIGH (ref 26.0–34.0)
MCHC: 36.8 g/dL — ABNORMAL HIGH (ref 30.0–36.0)
MCV: 93.4 fL (ref 80.0–100.0)
Monocytes Absolute: 0.6 10*3/uL (ref 0.1–1.0)
Monocytes Relative: 12 %
Neutro Abs: 3.4 10*3/uL (ref 1.7–7.7)
Neutrophils Relative %: 67 %
Platelets: 274 10*3/uL (ref 150–400)
RBC: 4.69 MIL/uL (ref 4.22–5.81)
RDW: 11 % — ABNORMAL LOW (ref 11.5–15.5)
WBC: 5 10*3/uL (ref 4.0–10.5)
nRBC: 0 % (ref 0.0–0.2)

## 2018-09-14 LAB — GLUCOSE, CAPILLARY: Glucose-Capillary: 163 mg/dL — ABNORMAL HIGH (ref 70–99)

## 2018-09-14 LAB — LIPASE, BLOOD: Lipase: 70 U/L — ABNORMAL HIGH (ref 11–51)

## 2018-09-14 LAB — TROPONIN I: Troponin I: <0.03 ng/mL (ref <0.03)

## 2018-09-14 MED ORDER — MORPHINE SULFATE (PF) 2 MG/ML IV SOLN
INTRAVENOUS | Status: AC
  Administered 2018-09-14: 11:00:00 2 mg via INTRAVENOUS
  Filled 2018-09-14: qty 1

## 2018-09-14 MED ORDER — ONDANSETRON HCL 4 MG/2ML IJ SOLN
INTRAMUSCULAR | Status: AC
  Administered 2018-09-14: 11:00:00 4 mg via INTRAVENOUS
  Filled 2018-09-14: qty 2

## 2018-09-14 MED ORDER — MORPHINE SULFATE (PF) 2 MG/ML IV SOLN
2.0000 mg | Freq: Once | INTRAVENOUS | Status: AC
  Administered 2018-09-14: 11:00:00 2 mg via INTRAVENOUS

## 2018-09-14 MED ORDER — IOHEXOL 240 MG/ML SOLN
25.0000 mL | Freq: Once | INTRAMUSCULAR | Status: AC | PRN
  Administered 2018-09-14: 12:00:00 25 mL via ORAL

## 2018-09-14 MED ORDER — MORPHINE SULFATE (PF) 4 MG/ML IV SOLN
4.0000 mg | Freq: Once | INTRAVENOUS | Status: AC
  Administered 2018-09-14: 12:00:00 4 mg via INTRAVENOUS
  Filled 2018-09-14: qty 1

## 2018-09-14 MED ORDER — IOHEXOL 300 MG/ML  SOLN
100.0000 mL | Freq: Once | INTRAMUSCULAR | Status: AC | PRN
  Administered 2018-09-14: 13:00:00 100 mL via INTRAVENOUS

## 2018-09-14 MED ORDER — ONDANSETRON HCL 4 MG/2ML IJ SOLN
4.0000 mg | Freq: Once | INTRAMUSCULAR | Status: AC
  Administered 2018-09-14: 11:00:00 4 mg via INTRAVENOUS

## 2018-09-14 MED ORDER — SUCRALFATE 1 G PO TABS: 1.0000 g | ORAL_TABLET | Freq: Four times a day (QID) | ORAL | 0 refills | Status: DC

## 2018-09-14 MED ORDER — SODIUM CHLORIDE 0.9 % IV SOLN
1000.0000 mL | Freq: Once | INTRAVENOUS | Status: AC
  Administered 2018-09-14: 1000 mL via INTRAVENOUS

## 2018-09-14 MED ORDER — MORPHINE SULFATE (PF) 2 MG/ML IV SOLN
2.0000 mg | Freq: Once | INTRAVENOUS | Status: AC
  Administered 2018-09-14: 15:00:00 2 mg via INTRAVENOUS
  Filled 2018-09-14: qty 1

## 2018-09-14 NOTE — ED Provider Notes (Signed)
[facility] Emergency Department Provider Note   ____________________________________________    I have reviewed the triage vital signs and the nursing notes.   HISTORY  Chief Complaint Loss of Consciousness     HPI Patrick Barajas. is a 53 y.o. male who presents after a syncopal episode.  Patient reports that for several months now he has had abdominal pain which over the last week has worsened and become severe enough that he reports decreased p.o. intake.  He has appointment with Dr. Gustavo Lah of GI.  This morning reports the pain was severe he was walking to the refrigerator and the next thing he knew he was waking up on the floor.  No history of seizures.  No loss of bladder continence.  Not witnessed.  No injuries.  No known postictal period.  Currently feels well except for his abdominal pain, no dizziness or headache or neuro deficits.  No chest pain  Past Medical History:  Diagnosis Date  . Anxiety   . GERD (gastroesophageal reflux disease)   . Hypertension     There are no active problems to display for this patient.   History reviewed. No pertinent surgical history.  Prior to Admission medications   Medication Sig Start Date End Date Taking? Authorizing Provider  citalopram (CELEXA) 20 MG tablet Take 20 mg by mouth daily.    Yes [provider]  lisinopril-hydrochlorothiazide (PRINZIDE,ZESTORETIC) 10-12.5 MG tablet Take 1 tablet by mouth daily. 02/10/18  Yes [provider]  Multiple Vitamins-Minerals (MULTIVITAMIN ADULT) TABS Take 1 tablet by mouth daily.   Yes [provider]  omeprazole (PRILOSEC) 40 MG capsule Take 40 mg by mouth daily.   Yes [provider]  simvastatin (ZOCOR) 20 MG tablet Take 20 mg by mouth at bedtime.    Yes [provider]  chlorpheniramine-HYDROcodone (TUSSIONEX PENNKINETIC ER) 10-8 MG/5ML SUER Take 5 mLs by mouth every 12 (twelve) hours as needed. Patient not  taking: Reported on 09/14/2018 04/07/18   Coral Spikes, DO  doxycycline (VIBRAMYCIN) 100 MG capsule Take 1 capsule (100 mg total) by mouth 2 (two) times daily. Patient not taking: Reported on 09/14/2018 04/07/18   Coral Spikes, DO  ondansetron (ZOFRAN ODT) 4 MG disintegrating tablet Take 1 tablet (4 mg total) by mouth every 6 (six) hours as needed for nausea or vomiting. Patient not taking: Reported on 09/14/2018 06/20/18   Delman Kitten, MD  oseltamivir (TAMIFLU) 75 MG capsule Take 1 capsule (75 mg total) by mouth every 12 (twelve) hours. Patient not taking: Reported on 09/14/2018 04/07/18   Coral Spikes, DO  sucralfate (CARAFATE) 1 g tablet Take 1 tablet (1 g total) by mouth 4 (four) times daily for 10 days. 09/14/18 09/24/18  Lavonia Drafts, MD     Allergies Patient has no known allergies.  Family History  Problem Relation Age of Onset  . Cancer Mother   . Heart failure Father     Social History Social History   Tobacco Use  . Smoking status: Never Smoker  . Smokeless tobacco: Former Network engineer Use Topics  . Alcohol use: No  . Drug use: No    Review of Systems  Constitutional: No fever/chills Eyes: No visual changes.  ENT: No sore throat. Cardiovascular: Denies chest pain. Respiratory: Denies shortness of breath. Gastrointestinal: As above Genitourinary: Negative for dysuria. Musculoskeletal: Negative for back pain. Skin: Negative for rash. Neurological: Negative for headaches or weakness   ____________________________________________   PHYSICAL EXAM:  VITAL SIGNS: ED Triage Vitals  Enc Vitals Group     BP 09/14/18 1044 135/85     Pulse Rate 09/14/18 1044 86     Resp 09/14/18 1045 18     Temp 09/14/18 1044 98.6 F (37 C)     Temp Source 09/14/18 1044 Oral     SpO2 09/14/18 1043 98 %     Weight 09/14/18 1045 81.6 kg (180 lb)     Height 09/14/18 1045 1.803 m (5\' 11" )     Head Circumference --      Peak Flow --      Pain Score 09/14/18 1045 10     Pain  Loc --      Pain Edu? --      Excl. in [place]? --     Constitutional: Alert and oriented.  Eyes: Conjunctivae are normal.  Head: Atraumatic. Nose: No congestion/rhinnorhea. Mouth/Throat: Mucous membranes are dry Neck:  Painless ROM, no vertebral tenderness palpation Cardiovascular: Normal rate, regular rhythm. Grossly normal heart sounds.  Good peripheral circulation. Respiratory: Normal respiratory effort.  No retractions. Lungs CTAB. Gastrointestinal: Soft and nontender. No distention.    Musculoskeletal: No lower extremity tenderness nor edema.  Warm and well perfused Neurologic:  Normal speech and language. No gross focal neurologic deficits are appreciated.  Skin:  Skin is warm, dry and intact. No rash noted. Psychiatric: Mood and affect are normal. Speech and behavior are normal.  ____________________________________________   LABS (all labs ordered are listed, but only abnormal results are displayed)  Labs Reviewed  GLUCOSE, CAPILLARY - Abnormal; Notable for the following components:      Result Value   Glucose-Capillary 163 (*)    All other components within normal limits  CBC WITH DIFFERENTIAL/PLATELET - Abnormal; Notable for the following components:   MCH 34.3 (*)    MCHC 36.8 (*)    RDW 11.0 (*)    All other components within normal limits  COMPREHENSIVE METABOLIC PANEL - Abnormal; Notable for the following components:   Sodium 127 (*)    Chloride 90 (*)    Glucose, Bld 175 (*)    Total Protein 8.3 (*)    AST 117 (*)    ALT 96 (*)    Total Bilirubin 2.0 (*)    All other components within normal limits  LIPASE, BLOOD - Abnormal; Notable for the following components:   Lipase 70 (*)    All other components within normal limits  TROPONIN I   ____________________________________________  EKG  ED ECG REPORT I, Lavonia Drafts, the attending physician, personally viewed and interpreted this ECG.  Date: 09/14/2018  Rhythm: normal sinus rhythm QRS Axis: normal  Intervals: normal ST/T Wave abnormalities: normal Narrative Interpretation: no evidence of acute ischemia  ____________________________________________  RADIOLOGY  CT abdomen pelvis unchanged from prior ____________________________________________   PROCEDURES  Procedure(s) performed: No  Procedures   Critical Care performed: No ____________________________________________   INITIAL IMPRESSION / ASSESSMENT AND PLAN / ED COURSE  Pertinent labs & imaging results that were available during my care of the patient were reviewed by me and considered in my medical decision making (see chart for details).  Patient presents after likely syncopal episode possibly related to dehydration.,  Clinically appears dehydrated.  No traumatic injuries on exam.  Pending labs will give IV fluids, IV Zofran, low dose of IV morphine for abdominal pain and consider reimaging his abdomen given worsening pain over the last 3 months  ----------------------------------------- 3:16 PM on 09/14/2018 -----------------------------------------  Patient's pain  is improved with treatment, CT results reviewed with the patient, he does have follow-up with GI, will start Carafate for possible gastritis    ____________________________________________   FINAL CLINICAL IMPRESSION(S) / ED DIAGNOSES  Final diagnoses:  Syncope, unspecified syncope type  Generalized abdominal pain        Note:  This document was prepared using Dragon voice recognition software and may include unintentional dictation errors.   Lavonia Drafts, MD 09/14/18 [id number]

## 2018-09-14 NOTE — ED Triage Notes (Signed)
Pt brought by EMS for left lower abdominal pain x 6 days with no appetite. Pt reports this morning he was feeling weak and passed out in the kitchen. Pt reports he woke up in the floor but was A&Ox4 after episode. Pt reports he has hardly eaten any solid foods x 6 days.

## 2018-09-15 ENCOUNTER — Other Ambulatory Visit: Payer: Self-pay | Admitting: Gastroenterology

## 2018-09-15 DIAGNOSIS — R748 Abnormal levels of other serum enzymes: Secondary | ICD-10-CM

## 2018-09-15 DIAGNOSIS — R1012 Left upper quadrant pain: Secondary | ICD-10-CM

## 2018-09-15 DIAGNOSIS — F1010 Alcohol abuse, uncomplicated: Secondary | ICD-10-CM

## 2018-09-20 DIAGNOSIS — F1010 Alcohol abuse, uncomplicated: Secondary | ICD-10-CM | POA: Diagnosis not present

## 2018-09-20 DIAGNOSIS — R748 Abnormal levels of other serum enzymes: Secondary | ICD-10-CM | POA: Diagnosis not present

## 2018-09-20 DIAGNOSIS — R7989 Other specified abnormal findings of blood chemistry: Secondary | ICD-10-CM | POA: Diagnosis not present

## 2018-09-20 DIAGNOSIS — R5381 Other malaise: Secondary | ICD-10-CM | POA: Diagnosis not present

## 2018-09-20 DIAGNOSIS — Z09 Encounter for follow-up examination after completed treatment for conditions other than malignant neoplasm: Secondary | ICD-10-CM | POA: Diagnosis not present

## 2018-09-20 DIAGNOSIS — R109 Unspecified abdominal pain: Secondary | ICD-10-CM | POA: Diagnosis not present

## 2018-09-26 ENCOUNTER — Ambulatory Visit: Payer: BLUE CROSS/BLUE SHIELD

## 2018-10-17 ENCOUNTER — Other Ambulatory Visit: Payer: Self-pay

## 2018-10-17 ENCOUNTER — Other Ambulatory Visit
Admission: RE | Admit: 2018-10-17 | Discharge: 2018-10-17 | Disposition: A | Discharge status: Home / Self Care | Payer: BC Managed Care – PPO | Source: Ambulatory Visit | Attending: Gastroenterology | Admitting: Gastroenterology

## 2018-10-17 DIAGNOSIS — Z1159 Encounter for screening for other viral diseases: Secondary | ICD-10-CM | POA: Diagnosis not present

## 2018-10-19 LAB — NOVEL CORONAVIRUS, NAA (HOSP ORDER, SEND-OUT TO REF LAB; TAT 18-24 HRS): SARS-CoV-2, NAA: NOT DETECTED

## 2018-10-20 ENCOUNTER — Ambulatory Visit
Admission: RE | Admit: 2018-10-20 | Discharge: 2018-10-20 | Disposition: A | Discharge status: Home / Self Care | Payer: BC Managed Care – PPO | Attending: Gastroenterology | Admitting: Gastroenterology

## 2018-10-20 ENCOUNTER — Ambulatory Visit: Payer: BC Managed Care – PPO | Admitting: Anesthesiology

## 2018-10-20 ENCOUNTER — Encounter
Admission: RE | Disposition: A | Discharge status: Home / Self Care | Payer: Self-pay | Source: Home / Self Care | Attending: Gastroenterology

## 2018-10-20 ENCOUNTER — Encounter: Payer: Self-pay | Admitting: Gastroenterology

## 2018-10-20 ENCOUNTER — Other Ambulatory Visit: Payer: Self-pay

## 2018-10-20 DIAGNOSIS — I10 Essential (primary) hypertension: Secondary | ICD-10-CM | POA: Insufficient documentation

## 2018-10-20 DIAGNOSIS — K640 First degree hemorrhoids: Secondary | ICD-10-CM | POA: Insufficient documentation

## 2018-10-20 DIAGNOSIS — K3189 Other diseases of stomach and duodenum: Secondary | ICD-10-CM | POA: Insufficient documentation

## 2018-10-20 DIAGNOSIS — K2210 Ulcer of esophagus without bleeding: Secondary | ICD-10-CM | POA: Insufficient documentation

## 2018-10-20 DIAGNOSIS — F419 Anxiety disorder, unspecified: Secondary | ICD-10-CM | POA: Insufficient documentation

## 2018-10-20 DIAGNOSIS — Z79899 Other long term (current) drug therapy: Secondary | ICD-10-CM | POA: Diagnosis not present

## 2018-10-20 DIAGNOSIS — K210 Gastro-esophageal reflux disease with esophagitis: Secondary | ICD-10-CM | POA: Diagnosis not present

## 2018-10-20 DIAGNOSIS — K648 Other hemorrhoids: Secondary | ICD-10-CM | POA: Diagnosis not present

## 2018-10-20 DIAGNOSIS — D123 Benign neoplasm of transverse colon: Secondary | ICD-10-CM | POA: Insufficient documentation

## 2018-10-20 DIAGNOSIS — K219 Gastro-esophageal reflux disease without esophagitis: Secondary | ICD-10-CM | POA: Diagnosis not present

## 2018-10-20 DIAGNOSIS — K449 Diaphragmatic hernia without obstruction or gangrene: Secondary | ICD-10-CM | POA: Insufficient documentation

## 2018-10-20 DIAGNOSIS — K621 Rectal polyp: Secondary | ICD-10-CM | POA: Diagnosis not present

## 2018-10-20 DIAGNOSIS — D128 Benign neoplasm of rectum: Secondary | ICD-10-CM | POA: Diagnosis not present

## 2018-10-20 DIAGNOSIS — D126 Benign neoplasm of colon, unspecified: Secondary | ICD-10-CM | POA: Diagnosis not present

## 2018-10-20 DIAGNOSIS — K2970 Gastritis, unspecified, without bleeding: Secondary | ICD-10-CM | POA: Diagnosis not present

## 2018-10-20 DIAGNOSIS — Z1211 Encounter for screening for malignant neoplasm of colon: Secondary | ICD-10-CM | POA: Diagnosis not present

## 2018-10-20 HISTORY — PX: COLONOSCOPY WITH PROPOFOL: SHX5780

## 2018-10-20 HISTORY — PX: ESOPHAGOGASTRODUODENOSCOPY (EGD) WITH PROPOFOL: SHX5813

## 2018-10-20 LAB — CBC WITH DIFFERENTIAL/PLATELET
Abs Immature Granulocytes: 0.03 10*3/uL (ref 0.00–0.07)
Basophils Absolute: 0.1 10*3/uL (ref 0.0–0.1)
Basophils Relative: 1 %
Eosinophils Absolute: 0.4 10*3/uL (ref 0.0–0.5)
Eosinophils Relative: 4 %
HCT: 44.7 % (ref 39.0–52.0)
Hemoglobin: 15.8 g/dL (ref 13.0–17.0)
Immature Granulocytes: 0 %
Lymphocytes Relative: 21 %
Lymphs Abs: 1.9 10*3/uL (ref 0.7–4.0)
MCH: 34.1 pg — ABNORMAL HIGH (ref 26.0–34.0)
MCHC: 35.3 g/dL (ref 30.0–36.0)
MCV: 96.5 fL (ref 80.0–100.0)
Monocytes Absolute: 0.6 10*3/uL (ref 0.1–1.0)
Monocytes Relative: 7 %
Neutro Abs: 6.2 10*3/uL (ref 1.7–7.7)
Neutrophils Relative %: 67 %
Platelets: 315 10*3/uL (ref 150–400)
RBC: 4.63 MIL/uL (ref 4.22–5.81)
RDW: 11.7 % (ref 11.5–15.5)
WBC: 9.3 10*3/uL (ref 4.0–10.5)
nRBC: 0 % (ref 0.0–0.2)

## 2018-10-20 LAB — PROTIME-INR
INR: 1 (ref 0.8–1.2)
Prothrombin Time: 13.1 seconds (ref 11.4–15.2)

## 2018-10-20 LAB — KOH PREP: Special Requests: NORMAL

## 2018-10-20 SURGERY — ESOPHAGOGASTRODUODENOSCOPY (EGD) WITH PROPOFOL
Anesthesia: General

## 2018-10-20 MED ORDER — SODIUM CHLORIDE 0.9 % IV SOLN: INTRAVENOUS | Status: DC

## 2018-10-20 MED ORDER — PROPOFOL 10 MG/ML IV BOLUS
INTRAVENOUS | Status: AC
  Filled 2018-10-20: qty 20

## 2018-10-20 MED ORDER — LIDOCAINE HCL (PF) 2 % IJ SOLN
INTRAMUSCULAR | Status: AC
  Filled 2018-10-20: qty 10

## 2018-10-20 MED ORDER — SODIUM CHLORIDE 0.9 % IV SOLN
INTRAVENOUS | Status: DC
  Administered 2018-10-20: 09:00:00 1000 mL via INTRAVENOUS

## 2018-10-20 MED ORDER — PROPOFOL 500 MG/50ML IV EMUL
INTRAVENOUS | Status: DC | PRN
  Administered 2018-10-20: 125 ug/kg/min via INTRAVENOUS

## 2018-10-20 MED ORDER — PROPOFOL 10 MG/ML IV BOLUS
INTRAVENOUS | Status: DC | PRN
  Administered 2018-10-20 (×2): 70 mg via INTRAVENOUS

## 2018-10-20 MED ORDER — LIDOCAINE HCL (CARDIAC) PF 100 MG/5ML IV SOSY
PREFILLED_SYRINGE | INTRAVENOUS | Status: DC | PRN
  Administered 2018-10-20: 45 mg via INTRAVENOUS

## 2018-10-20 MED ORDER — PROPOFOL 500 MG/50ML IV EMUL
INTRAVENOUS | Status: AC
  Filled 2018-10-20: qty 50

## 2018-10-20 NOTE — Anesthesia Postprocedure Evaluation (Signed)
Anesthesia Post Note  Patient: Patrick Barajas.  Procedure(s) Performed: ESOPHAGOGASTRODUODENOSCOPY (EGD) WITH PROPOFOL (N/A ) COLONOSCOPY WITH PROPOFOL (N/A )  Patient location during evaluation: PACU Anesthesia Type: General Level of consciousness: awake and alert Pain management: pain level controlled Vital Signs Assessment: post-procedure vital signs reviewed and stable Respiratory status: spontaneous breathing, nonlabored ventilation and respiratory function stable Cardiovascular status: blood pressure returned to baseline and stable Postop Assessment: no apparent nausea or vomiting Anesthetic complications: no     Last Vitals:  Vitals:   10/20/18 1202 10/20/18 1212  BP: (!) 130/91 136/87  Pulse: (!) 52 (!) 49  Resp: 17 12  Temp:    SpO2: 99% 97%    Last Pain:  Vitals:   10/20/18 1212  TempSrc:   PainSc: 0-No pain                 Durenda Hurt

## 2018-10-20 NOTE — Anesthesia Post-op Follow-up Note (Signed)
Anesthesia QCDR form completed.        

## 2018-10-20 NOTE — Op Note (Signed)
[facility] Gastroenterology Patient Name: Patrick Barajas Procedure Date: 10/20/2018 10:48 AM MRN: [id number] Account #: [id number] Date of Birth: [birth year] Admit Type: Outpatient Age: 53 Room: [facility] ENDO ROOM 2 Gender: Male Note Status: Finalized Procedure:            Colonoscopy Indications:          Screening for colorectal malignant neoplasm, This is                        the patient's first colonoscopy Providers:            Lollie Sails, MD Medicines:            Monitored Anesthesia Care Complications:        No immediate complications. Procedure:            Pre-Anesthesia Assessment:                       - ASA Grade Assessment: II - A patient with mild                        systemic disease.                       After obtaining informed consent, the colonoscope was                        passed under direct vision. Throughout the procedure,                        the patient's blood pressure, pulse, and oxygen                        saturations were monitored continuously. The                        Colonoscope was introduced through the anus and                        advanced to the the cecum, identified by appendiceal                        orifice and ileocecal valve. The colonoscopy was                        performed without difficulty. The patient tolerated the                        procedure well. The quality of the bowel preparation                        was good. Findings:      A 2 mm polyp was found in the rectum. The polyp was sessile. The polyp       was removed with a cold biopsy forceps. Resection and retrieval were       complete.      A 2 mm polyp was found in the proximal transverse colon. The polyp was       sessile. The polyp was removed with a cold biopsy forceps. Resection and       retrieval were complete.      Non-bleeding internal  hemorrhoids were found during retroflexion. The       hemorrhoids were small and Grade I  (internal hemorrhoids that do not       prolapse).      The exam was otherwise without abnormality.      The digital rectal exam was normal. Impression:           - One 2 mm polyp in the rectum, removed with a cold                        biopsy forceps. Resected and retrieved.                       - One 2 mm polyp in the proximal transverse colon,                        removed with a cold biopsy forceps. Resected and                        retrieved.                       - Non-bleeding internal hemorrhoids.                       - The examination was otherwise normal. Recommendation:       - Discharge patient to home.                       - Advance diet as tolerated.                       - Discharge patient to home.                       - Telephone GI clinic for pathology results in 1 week. Procedure Code(s):    --- Professional ---                       [phone], Colonoscopy, flexible; with biopsy, single or                        multiple Diagnosis Code(s):    --- Professional ---                       Z12.11, Encounter for screening for malignant neoplasm                        of colon                       K62.1, Rectal polyp                       K63.5, Polyp of colon                       K64.0, First degree hemorrhoids CPT copyright 2019 American Medical Association. All rights reserved. The codes documented in this report are preliminary and upon coder review may  be revised to meet current compliance requirements. Lollie Sails, MD 10/20/2018 11:39:01 AM This report has been signed electronically. Number of Addenda: 0 Note Initiated On: 10/20/2018 10:48 AM Scope Withdrawal Time: 0  hours 6 minutes 46 seconds  Total Procedure Duration: 0 hours 12 minutes 58 seconds       [facility]

## 2018-10-20 NOTE — Op Note (Signed)
[facility] Gastroenterology Patient Name: Patrick Barajas Procedure Date: 10/20/2018 10:49 AM MRN: [id number] Account #: [id number] Date of Birth: March 28, 1966 Admit Type: Outpatient Age: 53 Room: [facility] ENDO ROOM 2 Gender: Male Note Status: Finalized Procedure:            Upper GI endoscopy Indications:          Abdominal pain in the left upper quadrant Providers:            Lollie Sails, MD Medicines:            Monitored Anesthesia Care Complications:        No immediate complications. Procedure:            Pre-Anesthesia Assessment:                       - ASA Grade Assessment: II - A patient with mild                        systemic disease.                       After obtaining informed consent, the endoscope was                        passed under direct vision. Throughout the procedure,                        the patient's blood pressure, pulse, and oxygen                        saturations were monitored continuously. The Endoscope                        was introduced through the mouth, and advanced to the                        third part of duodenum. The upper GI endoscopy was                        accomplished without difficulty. The patient tolerated                        the procedure well. Findings:      The Z-line was irregular. Biopsies were taken with a cold forceps for       histology.      A small hiatal hernia was found. The Z-line was a variable distance from       incisors; the hiatal hernia was sliding.      A few dispersed, 1 to 3 mm non-bleeding erosions were found in the       gastric antrum. There were no stigmata of recent bleeding. Biopsies were       taken with a cold forceps for histology. Biopsies were taken with a cold       forceps for Helicobacter pylori testing.      The cardia and gastric fundus were normal on retroflexion.      The exam of the stomach was otherwise normal.      Diffuse mucosal flattening was found in the  entire examined duodenum.       Biopsies were taken with a cold forceps for histology.  Patchy, white plaques were found in the middle third of the esophagus       and in the lower third of the esophagus. Cells for cytology were       obtained by brushing. Impression:           - Z-line irregular. Biopsied.                       - Small hiatal hernia.                       - Non-bleeding erosive gastropathy. Biopsied.                       - Flattened mucosa was found in the duodenum, not                        consistent with celiac disease. Biopsied. Recommendation:       - Use Prilosec (omeprazole) 20 mg PO daily daily. Procedure Code(s):    --- Professional ---                       [phone], Esophagogastroduodenoscopy, flexible, transoral;                        with biopsy, single or multiple Diagnosis Code(s):    --- Professional ---                       K22.8, Other specified diseases of esophagus                       K44.9, Diaphragmatic hernia without obstruction or                        gangrene                       K31.89, Other diseases of stomach and duodenum                       R10.12, Left upper quadrant pain CPT copyright 2019 American Medical Association. All rights reserved. The codes documented in this report are preliminary and upon coder review may  be revised to meet current compliance requirements. Lollie Sails, MD 10/20/2018 11:20:26 AM This report has been signed electronically. Number of Addenda: 0 Note Initiated On: 10/20/2018 10:49 AM      [facility]

## 2018-10-20 NOTE — Anesthesia Preprocedure Evaluation (Addendum)
Anesthesia Evaluation  Patient identified by MRN, date of birth, ID band Patient awake    Reviewed: Allergy & Precautions, H&P , NPO status , Patient's Chart, lab work & pertinent test results  Airway Mallampati: II  TM Distance: >3 FB     Dental  (+) Upper Dentures   Pulmonary neg pulmonary ROS, neg shortness of breath, neg COPD, neg recent URI,           Cardiovascular hypertension, (-) angina     Neuro/Psych Anxiety negative neurological ROS     GI/Hepatic Neg liver ROS, GERD  Controlled,  Endo/Other  negative endocrine ROS  Renal/GU negative Renal ROS  negative genitourinary   Musculoskeletal   Abdominal   Peds  Hematology negative hematology ROS (+)   Anesthesia Other Findings Past Medical History: No date: Anxiety No date: GERD (gastroesophageal reflux disease) No date: Hypertension  History reviewed. No pertinent surgical history.  BMI    Body Mass Index: 25.80 kg/m      Reproductive/Obstetrics negative OB ROS                            Anesthesia Physical Anesthesia Plan  ASA: II  Anesthesia Plan: General   Post-op Pain Management:    Induction:   PONV Risk Score and Plan: Propofol infusion and TIVA  Airway Management Planned: Nasal Cannula and Natural Airway  Additional Equipment:   Intra-op Plan:   Post-operative Plan:   Informed Consent: I have reviewed the patients History and Physical, chart, labs and discussed the procedure including the risks, benefits and alternatives for the proposed anesthesia with the patient or authorized representative who has indicated his/her understanding and acceptance.     Dental Advisory Given  Plan Discussed with: Anesthesiologist and CRNA  Anesthesia Plan Comments:         Anesthesia Quick Evaluation

## 2018-10-20 NOTE — Transfer of Care (Signed)
Immediate Anesthesia Transfer of Care Note  Patient: Patrick Barajas.  Procedure(s) Performed: ESOPHAGOGASTRODUODENOSCOPY (EGD) WITH PROPOFOL (N/A ) COLONOSCOPY WITH PROPOFOL (N/A )  Patient Location: PACU and Endoscopy Unit  Anesthesia Type:General  Level of Consciousness: awake  Airway & Oxygen Therapy: Patient Spontanous Breathing  Post-op Assessment: Report given to RN  Post vital signs: stable  Last Vitals:  Vitals Value Taken Time  BP 104/68 10/20/18 1142  Temp 36 C 10/20/18 1142  Pulse 65 10/20/18 1142  Resp 15 10/20/18 1142  SpO2 96 % 10/20/18 1142    Last Pain:  Vitals:   10/20/18 1142  TempSrc: Tympanic  PainSc: 0-No pain         Complications: No apparent anesthesia complications

## 2018-10-20 NOTE — H&P (Signed)
Outpatient short stay form Pre-procedure 10/20/2018 10:52 AM Patrick Sails MD  Primary Physician: Gaetano Net  NP  Reason for visit: EGD and colonoscopy  History of present illness: Patient is a 53 year old male presenting today with complaint of left upper quadrant pain and for colon cancer screening.  He is never had a colonoscopy before.  He was seen in the office several weeks ago with complaint of left upper quadrant pain.  He had modified several things in his diet including cutting back on alcohol use he is taking a proton pump inhibitor daily.  Overall this is improved.  Does have a history of mildly elevated lipase [place] related to above.  Tolerated his prep well.  He takes no aspirin or blood thinning agent.    Current Facility-Administered Medications:  .  0.9 %  sodium chloride infusion, , Intravenous, Continuous, Patrick Sails, MD .  0.9 %  sodium chloride infusion, , Intravenous, Continuous, Patrick Sails, MD, Last Rate: 20 mL/hr at 10/20/18 0900 .  0.9 %  sodium chloride infusion, , Intravenous, Continuous, Patrick Sails, MD  Medications Prior to Admission  Medication Sig Dispense Refill Last Dose  . chlorpheniramine-HYDROcodone (TUSSIONEX PENNKINETIC ER) 10-8 MG/5ML SUER Take 5 mLs by mouth every 12 (twelve) hours as needed. 60 mL 0 10/19/2018 at Unknown time  . citalopram (CELEXA) 20 MG tablet Take 20 mg by mouth daily.    10/19/2018 at Unknown time  . doxycycline (VIBRAMYCIN) 100 MG capsule Take 1 capsule (100 mg total) by mouth 2 (two) times daily. 14 capsule 0 10/19/2018 at Unknown time  . lisinopril-hydrochlorothiazide (PRINZIDE,ZESTORETIC) 10-12.5 MG tablet Take 1 tablet by mouth daily.   10/19/2018 at Unknown time  . Multiple Vitamins-Minerals (MULTIVITAMIN ADULT) TABS Take 1 tablet by mouth daily.   Past Week at Unknown time  . omeprazole (PRILOSEC) 40 MG capsule Take 40 mg by mouth daily.   10/19/2018 at Unknown time  . ondansetron (ZOFRAN ODT) 4 MG  disintegrating tablet Take 1 tablet (4 mg total) by mouth every 6 (six) hours as needed for nausea or vomiting. 20 tablet 0 10/19/2018 at Unknown time  . simvastatin (ZOCOR) 20 MG tablet Take 20 mg by mouth at bedtime.    10/19/2018 at Unknown time  . oseltamivir (TAMIFLU) 75 MG capsule Take 1 capsule (75 mg total) by mouth every 12 (twelve) hours. (Patient not taking: Reported on 09/14/2018) 10 capsule 0 Not Taking at Unknown time  . sucralfate (CARAFATE) 1 g tablet Take 1 tablet (1 g total) by mouth 4 (four) times daily for 10 days. 38 tablet 0      No Known Allergies   Past Medical History:  Diagnosis Date  . Anxiety   . GERD (gastroesophageal reflux disease)   . Hypertension     Review of systems:      Physical Exam    Heart and lungs: Rate and rhythm without rub or gallop, lungs are bilaterally clear.    HEENT: Normocephalic atraumatic eyes are anicteric    Other:    Pertinant exam for procedure: Soft nontender nondistended bowel sounds positive normoactive.    Planned proceedures: EGD, colonoscopy and indicated procedures. I have discussed the risks benefits and complications of procedures to include not limited to bleeding, infection, perforation and the risk of sedation and the patient wishes to proceed.    Patrick Sails, MD Gastroenterology 10/20/2018  10:52 AM

## 2018-10-23 LAB — SURGICAL PATHOLOGY

## 2019-03-02 DIAGNOSIS — Z20828 Contact with and (suspected) exposure to other viral communicable diseases: Secondary | ICD-10-CM | POA: Diagnosis not present

## 2019-07-01 DIAGNOSIS — J019 Acute sinusitis, unspecified: Secondary | ICD-10-CM | POA: Diagnosis not present

## 2019-08-31 ENCOUNTER — Other Ambulatory Visit: Payer: Self-pay

## 2019-08-31 ENCOUNTER — Encounter: Payer: Self-pay | Admitting: Family Medicine

## 2019-08-31 ENCOUNTER — Ambulatory Visit (INDEPENDENT_AMBULATORY_CARE_PROVIDER_SITE_OTHER): Payer: BC Managed Care – PPO | Admitting: Family Medicine

## 2019-08-31 VITALS — BP 134/72 | HR 73 | Temp 97.3°F | Ht 71.0 in | Wt 199.0 lb

## 2019-08-31 DIAGNOSIS — Z7689 Persons encountering health services in other specified circumstances: Secondary | ICD-10-CM

## 2019-08-31 DIAGNOSIS — K219 Gastro-esophageal reflux disease without esophagitis: Secondary | ICD-10-CM

## 2019-08-31 DIAGNOSIS — I10 Essential (primary) hypertension: Secondary | ICD-10-CM

## 2019-08-31 DIAGNOSIS — F419 Anxiety disorder, unspecified: Secondary | ICD-10-CM

## 2019-08-31 DIAGNOSIS — Z720 Tobacco use: Secondary | ICD-10-CM

## 2019-08-31 DIAGNOSIS — E781 Pure hyperglyceridemia: Secondary | ICD-10-CM | POA: Diagnosis not present

## 2019-08-31 LAB — CBC WITH DIFFERENTIAL/PLATELET
Basophils Absolute: 0.1 10*3/uL (ref 0.0–0.1)
Basophils Relative: 0.7 % (ref 0.0–3.0)
Eosinophils Absolute: 0.2 10*3/uL (ref 0.0–0.7)
Eosinophils Relative: 1.9 % (ref 0.0–5.0)
HCT: 41.2 % (ref 39.0–52.0)
Hemoglobin: 14.4 g/dL (ref 13.0–17.0)
Lymphocytes Relative: 28 % (ref 12.0–46.0)
Lymphs Abs: 2.6 10*3/uL (ref 0.7–4.0)
MCHC: 35 g/dL (ref 30.0–36.0)
MCV: 98 fl (ref 78.0–100.0)
Monocytes Absolute: 0.8 10*3/uL (ref 0.1–1.0)
Monocytes Relative: 8.9 % (ref 3.0–12.0)
Neutro Abs: 5.7 10*3/uL (ref 1.4–7.7)
Neutrophils Relative %: 60.5 % (ref 43.0–77.0)
Platelets: 377 10*3/uL (ref 150.0–400.0)
RBC: 4.2 Mil/uL — ABNORMAL LOW (ref 4.22–5.81)
RDW: 13 % (ref 11.5–15.5)
WBC: 9.4 10*3/uL (ref 4.0–10.5)

## 2019-08-31 LAB — COMPREHENSIVE METABOLIC PANEL
ALT: 28 U/L (ref 0–53)
AST: 31 U/L (ref 0–37)
Albumin: 4.4 g/dL (ref 3.5–5.2)
Alkaline Phosphatase: 75 U/L (ref 39–117)
BUN: 10 mg/dL (ref 6–23)
CO2: 30 mEq/L (ref 19–32)
Calcium: 9.7 mg/dL (ref 8.4–10.5)
Chloride: 101 mEq/L (ref 96–112)
Creatinine, Ser: 0.98 mg/dL (ref 0.40–1.50)
GFR: 80.4 mL/min (ref >60.00)
Glucose, Bld: 99 mg/dL (ref 70–99)
Potassium: 4.2 mEq/L (ref 3.5–5.1)
Sodium: 135 mEq/L (ref 135–145)
Total Bilirubin: 1 mg/dL (ref 0.2–1.2)
Total Protein: 7.5 g/dL (ref 6.0–8.3)

## 2019-08-31 LAB — LDL CHOLESTEROL, DIRECT: Direct LDL: 111 mg/dL

## 2019-08-31 LAB — LIPID PANEL
Cholesterol: 218 mg/dL — ABNORMAL HIGH (ref 0–200)
HDL: 39.7 mg/dL (ref >39.00)
NonHDL: 177.87
Total CHOL/HDL Ratio: 5
Triglycerides: 319 mg/dL — ABNORMAL HIGH (ref 0.0–149.0)
VLDL: 63.8 mg/dL — ABNORMAL HIGH (ref 0.0–40.0)

## 2019-08-31 NOTE — Progress Notes (Signed)
   Subjective:    Patient ID: Patrick Householder., male    DOB: [birth year], 54 y.o.   MRN: [id number]  HPI Chief Complaint  Patient presents with  . Establish Care    New patient, no concerns.    This is a 54 yo male who presents today to establish care. Has a 37 yo son, enjoys fishing, hunting. Shares custody with girlfriend.   Last CPE- several years ago PSA- 05/16/2017 Colonoscopy- 10/20/2018 Tdap- 02/19/2014 Flu- never Covid vaccine- declines Dental- regular Eye- overdue Exercise- work- walking, house work, yard work Tobacco- dips, not daily, more with recreational activities  Jerrye Bushy- takes omeprazole, irritated with greasy foods, eats fast food frequently.   GAD- takes citalopram, well controlled  HTN- no side effects of medication, no edema  Review of Systems Denies headaches, visual changes, chest pain, SOB, diarrhea, constipation, blood in stool, hematuria.     Objective:   Physical Exam Physical Exam  Constitutional: Oriented to person, place, and time. He appears well-developed and well-nourished.  HENT:  Head: Normocephalic and atraumatic.  Eyes: Conjunctivae are normal.  Neck: Normal range of motion. Neck supple.  Cardiovascular: Normal rate, regular rhythm and normal heart sounds.   Pulmonary/Chest: Effort normal and breath sounds normal.  Musculoskeletal: No edema.  Neurological: Alert and oriented to person, place, and time.  Skin: Skin is warm and dry.  Psychiatric: Normal mood and affect. Behavior is normal. Judgment and thought content normal.  Vitals reviewed.   BP 134/72   Pulse 73   Temp (!) 97.3 F (36.3 C) (Tympanic)   Ht 5\' 11"  (1.803 m)   Wt 199 lb (90.3 kg)   SpO2 95%   BMI 27.75 kg/m  Wt Readings from Last 3 Encounters:  08/31/19 199 lb (90.3 kg)  10/20/18 185 lb (83.9 kg)  09/14/18 180 lb (81.6 kg)   Ideal body weight: 75.3 kg (166 lb 0.1 oz) Adjusted ideal body weight: 81.3 kg (179 lb 3.3 oz)     Assessment & Plan:  1. Encounter  to establish care - reviewed available records in EMR  2. Gastroesophageal reflux disease without esophagitis - some breakthrough symptoms with food indiscretion, discussed prn med use, weight loss, avoiding triggers - CBC with Differential  3. Essential hypertension - well controlled, continue current medication - Comprehensive metabolic panel - CBC with Differential  4. Hypertriglyceridemia - Lipid Panel  5. Anxiety - feels that symptoms are well controlled on citalopram  6. Tobacco use - encouraged him to stop using completely and discussed harmful effects on oral health  - follow up in 6 months  This visit occurred during the SARS-CoV-2 public health emergency.  Safety protocols were in place, including screening questions prior to the visit, additional usage of staff PPE, and extensive cleaning of exam room while observing appropriate contact time as indicated for disinfecting solutions.      Patrick Reamer, FNP-BC  [facility]  09/01/2019 10:01 AM

## 2019-08-31 NOTE — Patient Instructions (Addendum)
Good to see you today, follow up in 6 months for your complete physical  For breakthrough acid indigestion- Gaviscon, Mylanta or Mylecion.    Gastroesophageal Reflux Disease, Adult Gastroesophageal reflux (GER) happens when acid from the stomach flows up into the tube that connects the mouth and the stomach (esophagus). Normally, food travels down the esophagus and stays in the stomach to be digested. However, when a person has GER, food and stomach acid sometimes move back up into the esophagus. If this becomes a more serious problem, the person may be diagnosed with a disease called gastroesophageal reflux disease (GERD). GERD occurs when the reflux:  Happens often.  Causes frequent or severe symptoms.  Causes problems such as damage to the esophagus. When stomach acid comes in contact with the esophagus, the acid may cause soreness (inflammation) in the esophagus. Over time, GERD may create small holes (ulcers) in the lining of the esophagus. What are the causes? This condition is caused by a problem with the muscle between the esophagus and the stomach (lower esophageal sphincter, or LES). Normally, the LES muscle closes after food passes through the esophagus to the stomach. When the LES is weakened or abnormal, it does not close properly, and that allows food and stomach acid to go back up into the esophagus. The LES can be weakened by certain dietary substances, medicines, and medical conditions, including:  Tobacco use.  Pregnancy.  Having a hiatal hernia.  Alcohol use.  Certain foods and beverages, such as coffee, chocolate, onions, and peppermint. What increases the risk? You are more likely to develop this condition if you:  Have an increased body weight.  Have a connective tissue disorder.  Use NSAID medicines. What are the signs or symptoms? Symptoms of this condition include:  Heartburn.  Difficult or painful swallowing.  The feeling of having a lump in the  throat.  Abitter taste in the mouth.  Bad breath.  Having a large amount of saliva.  Having an upset or bloated stomach.  Belching.  Chest pain. Different conditions can cause chest pain. Make sure you see your health care provider if you experience chest pain.  Shortness of breath or wheezing.  Ongoing (chronic) cough or a night-time cough.  Wearing away of tooth enamel.  Weight loss. How is this diagnosed? Your health care provider will take a medical history and perform a physical exam. To determine if you have mild or severe GERD, your health care provider may also monitor how you respond to treatment. You may also have tests, including:  A test to examine your stomach and esophagus with a small camera (endoscopy).  A test thatmeasures the acidity level in your esophagus.  A test thatmeasures how much pressure is on your esophagus.  A barium swallow or modified barium swallow test to show the shape, size, and functioning of your esophagus. How is this treated? The goal of treatment is to help relieve your symptoms and to prevent complications. Treatment for this condition may vary depending on how severe your symptoms are. Your health care provider may recommend:  Changes to your diet.  Medicine.  Surgery. Follow these instructions at home: Eating and drinking   Follow a diet as recommended by your health care provider. This may involve avoiding foods and drinks such as: ? Coffee and tea (with or without caffeine). ? Drinks that containalcohol. ? Energy drinks and sports drinks. ? Carbonated drinks or sodas. ? Chocolate and cocoa. ? Peppermint and mint flavorings. ? Garlic and  onions. ? Horseradish. ? Spicy and acidic foods, including peppers, chili powder, curry powder, vinegar, hot sauces, and barbecue sauce. ? Citrus fruit juices and citrus fruits, such as oranges, lemons, and limes. ? Tomato-based foods, such as red sauce, chili, salsa, and pizza with  red sauce. ? Fried and fatty foods, such as donuts, french fries, potato chips, and high-fat dressings. ? High-fat meats, such as hot dogs and fatty cuts of red and white meats, such as rib eye steak, sausage, ham, and bacon. ? High-fat dairy items, such as whole milk, butter, and cream cheese.  Eat small, frequent meals instead of large meals.  Avoid drinking large amounts of liquid with your meals.  Avoid eating meals during the 2-3 hours before bedtime.  Avoid lying down right after you eat.  Do not exercise right after you eat. Lifestyle   Do not use any products that contain nicotine or tobacco, such as cigarettes, e-cigarettes, and chewing tobacco. If you need help quitting, ask your health care provider.  Try to reduce your stress by using methods such as yoga or meditation. If you need help reducing stress, ask your health care provider.  If you are overweight, reduce your weight to an amount that is healthy for you. Ask your health care provider for guidance about a safe weight loss goal. General instructions  Pay attention to any changes in your symptoms.  Take over-the-counter and prescription medicines only as told by your health care provider. Do not take aspirin, ibuprofen, or other NSAIDs unless your health care provider told you to do so.  Wear loose-fitting clothing. Do not wear anything tight around your waist that causes pressure on your abdomen.  Raise (elevate) the head of your bed about 6 inches (15 cm).  Avoid bending over if this makes your symptoms worse.  Keep all follow-up visits as told by your health care provider. This is important. Contact a health care provider if:  You have: ? New symptoms. ? Unexplained weight loss. ? Difficulty swallowing or it hurts to swallow. ? Wheezing or a persistent cough. ? A hoarse voice.  Your symptoms do not improve with treatment. Get help right away if you:  Have pain in your arms, neck, jaw, teeth, or  back.  Feel sweaty, dizzy, or light-headed.  Have chest pain or shortness of breath.  Vomit and your vomit looks like blood or coffee grounds.  Faint.  Have stool that is bloody or black.  Cannot swallow, drink, or eat. Summary  Gastroesophageal reflux happens when acid from the stomach flows up into the esophagus. GERD is a disease in which the reflux happens often, causes frequent or severe symptoms, or causes problems such as damage to the esophagus.  Treatment for this condition may vary depending on how severe your symptoms are. Your health care provider may recommend diet and lifestyle changes, medicine, or surgery.  Contact a health care provider if you have new or worsening symptoms.  Take over-the-counter and prescription medicines only as told by your health care provider. Do not take aspirin, ibuprofen, or other NSAIDs unless your health care provider told you to do so.  Keep all follow-up visits as told by your health care provider. This is important. This information is not intended to replace advice given to you by your health care provider. Make sure you discuss any questions you have with your health care provider. Document Revised: 10/19/2017 Document Reviewed: 10/19/2017 Elsevier Patient Education  [employer].

## 2019-09-01 ENCOUNTER — Encounter: Payer: Self-pay | Admitting: Family Medicine

## 2019-09-01 DIAGNOSIS — Z720 Tobacco use: Secondary | ICD-10-CM | POA: Insufficient documentation

## 2019-09-14 ENCOUNTER — Other Ambulatory Visit: Payer: Self-pay

## 2019-09-14 ENCOUNTER — Telehealth: Payer: Self-pay

## 2019-09-14 ENCOUNTER — Emergency Department: Payer: BC Managed Care – PPO

## 2019-09-14 ENCOUNTER — Emergency Department
Admission: EM | Admit: 2019-09-14 | Discharge: 2019-09-14 | Disposition: A | Discharge status: Home / Self Care | Payer: BC Managed Care – PPO | Attending: Emergency Medicine | Admitting: Emergency Medicine

## 2019-09-14 DIAGNOSIS — R0789 Other chest pain: Secondary | ICD-10-CM | POA: Insufficient documentation

## 2019-09-14 DIAGNOSIS — Z79899 Other long term (current) drug therapy: Secondary | ICD-10-CM | POA: Diagnosis not present

## 2019-09-14 DIAGNOSIS — R109 Unspecified abdominal pain: Secondary | ICD-10-CM | POA: Diagnosis not present

## 2019-09-14 DIAGNOSIS — R079 Chest pain, unspecified: Secondary | ICD-10-CM

## 2019-09-14 DIAGNOSIS — I10 Essential (primary) hypertension: Secondary | ICD-10-CM | POA: Insufficient documentation

## 2019-09-14 LAB — COMPREHENSIVE METABOLIC PANEL
ALT: 23 U/L (ref 0–44)
AST: 25 U/L (ref 15–41)
Albumin: 4.2 g/dL (ref 3.5–5.0)
Alkaline Phosphatase: 67 U/L (ref 38–126)
Anion gap: 9 (ref 5–15)
BUN: 10 mg/dL (ref 6–20)
CO2: 26 mmol/L (ref 22–32)
Calcium: 9.7 mg/dL (ref 8.9–10.3)
Chloride: 104 mmol/L (ref 98–111)
Creatinine, Ser: 0.95 mg/dL (ref 0.61–1.24)
GFR calc Af Amer: >60 mL/min (ref >60)
GFR calc non Af Amer: >60 mL/min (ref >60)
Glucose, Bld: 97 mg/dL (ref 70–99)
Potassium: 3.6 mmol/L (ref 3.5–5.1)
Sodium: 139 mmol/L (ref 135–145)
Total Bilirubin: 1.4 mg/dL — ABNORMAL HIGH (ref 0.3–1.2)
Total Protein: 8.2 g/dL — ABNORMAL HIGH (ref 6.5–8.1)

## 2019-09-14 LAB — CBC
HCT: 42 % (ref 39.0–52.0)
Hemoglobin: 15 g/dL (ref 13.0–17.0)
MCH: 33.6 pg (ref 26.0–34.0)
MCHC: 35.7 g/dL (ref 30.0–36.0)
MCV: 94.2 fL (ref 80.0–100.0)
Platelets: 369 10*3/uL (ref 150–400)
RBC: 4.46 MIL/uL (ref 4.22–5.81)
RDW: 11.8 % (ref 11.5–15.5)
WBC: 10.1 10*3/uL (ref 4.0–10.5)
nRBC: 0 % (ref 0.0–0.2)

## 2019-09-14 LAB — LIPASE, BLOOD: Lipase: 40 U/L (ref 11–51)

## 2019-09-14 LAB — TROPONIN I (HIGH SENSITIVITY): Troponin I (High Sensitivity): 3 ng/L (ref <18)

## 2019-09-14 NOTE — Discharge Instructions (Addendum)
There are no signs of a heart attack today if develop worsening pain is not going away should return to the ER for further work-up and evaluation.  You can discuss with your primary care doctor if you should be referred for a stress test.  You should return to the ER if you develop any other concerns

## 2019-09-14 NOTE — ED Triage Notes (Signed)
Mid to upper chest pain, abdominal pain that began this AM. Pt alert and oriented X4, cooperative, RR even and unlabored, color WNL. Pt in NAD.

## 2019-09-14 NOTE — Telephone Encounter (Signed)
[place] began with dull consistent lt side upper abd pain and on and off sharp rt sided CP that pt said might have gone into his rt arm. No SOB. Pt went home took tylenol and laid down; pt still has upper lt abd pain (not as bad as earlier) and pt has had abd pain on and off for a long time. Pt has no covid symptoms, no travel and no known exposure to + covid.  Pt took omeprazole in early AM but did eat a cheeseburger which might have set off upper abd pain. Pt is driving and cannot take BP. Now pt still has rt sided upper CP and pt is concerned. Pt went to [facility] but there was a 3 hr wait; I called [facility] in [place] and they did not have a wait but with pts symptoms advised probably better if pt goes to ED. I called pt back and before I could tell him what I had found out pt said he was going to [facility] ED now.  FYI to Glenda Chroman FNP.

## 2019-09-14 NOTE — ED Provider Notes (Signed)
[facility] Emergency Department Provider Note  ____________________________________________   First MD Initiated Contact with Patient 09/14/19 1554     (approximate)  I have reviewed the triage vital signs and the nursing notes.   HISTORY  Chief Complaint Chest Pain and Abdominal Pain    HPI Patrick Barajas. is a 54 y.o. male with acid reflux, hypertension who comes in with chest pain.  Patient states that he is very active and carries a lot of heavy weights at work and has never had any chest pain.  He states that he was fishing today not exerting himself when he had some sharp stabbing chest pains to the right side of his chest that were intermittent, brief, lasting few seconds, went away on their own, unclear what brought it on.  Denies any shortness of breath.  He states that he does have some epigastric abdominal pain but that is baseline for his acid reflux.  He said he has had a full work-up including endoscopies and CT imaging that have all been negative.  Recent CT scan are reviewed was from 1 year ago and had no gallstones.  He states that he is not concerned about his abdominal pain but does want to make sure that this chest pain was not related to his heart.  He denies any symptoms currently.          Past Medical History:  Diagnosis Date  . Anxiety   . GERD (gastroesophageal reflux disease)   . Hypertension     Patient Active Problem List   Diagnosis Date Noted  . Tobacco use 09/01/2019  . Anxiety 08/31/2019  . GERD (gastroesophageal reflux disease) 08/31/2019  . HTN (hypertension) 08/31/2019  . Hypertriglyceridemia 08/31/2019    Past Surgical History:  Procedure Laterality Date  . COLONOSCOPY WITH PROPOFOL N/A 10/20/2018   Procedure: COLONOSCOPY WITH PROPOFOL;  Surgeon: Lollie Sails, MD;  Location: [facility] ENDOSCOPY;  Service: Endoscopy;  Laterality: N/A;  . ESOPHAGOGASTRODUODENOSCOPY (EGD) WITH PROPOFOL N/A 10/20/2018   Procedure: ESOPHAGOGASTRODUODENOSCOPY (EGD) WITH PROPOFOL;  Surgeon: Lollie Sails, MD;  Location: [facility] ENDOSCOPY;  Service: Endoscopy;  Laterality: N/A;    Prior to Admission medications   Medication Sig Start Date End Date Taking? Authorizing Provider  citalopram (CELEXA) 20 MG tablet Take 20 mg by mouth daily.     [provider]  lisinopril-hydrochlorothiazide (PRINZIDE,ZESTORETIC) 10-12.5 MG tablet Take 1 tablet by mouth daily. 02/10/18   [provider]  Multiple Vitamins-Minerals (MULTIVITAMIN ADULT) TABS Take 1 tablet by mouth daily.    [provider]  omeprazole (PRILOSEC) 40 MG capsule Take 40 mg by mouth daily.    [provider]  simvastatin (ZOCOR) 20 MG tablet Take 20 mg by mouth at bedtime.     [provider]    Allergies Patient has no known allergies.  Family History  Problem Relation Age of Onset  . Cancer Mother   . Heart failure Father     Social History Social History   Tobacco Use  . Smoking status: Never Smoker  . Smokeless tobacco: Former Network engineer Use Topics  . Alcohol use: No  . Drug use: No      Review of Systems Constitutional: No fever/chills Eyes: No visual changes. ENT: No sore throat. Cardiovascular: Positive chest pain Respiratory: Denies shortness of breath. Gastrointestinal: Positive abdominal pain no nausea, no vomiting.  No diarrhea.  No constipation. Genitourinary: Negative for dysuria. Musculoskeletal: Negative for back pain. Skin: Negative for  rash. Neurological: Negative for headaches, focal weakness or numbness. All other ROS negative ____________________________________________   PHYSICAL EXAM:  VITAL SIGNS: ED Triage Vitals  Enc Vitals Group     BP 09/14/19 1455 (!) 144/85     Pulse Rate 09/14/19 1455 86     Resp 09/14/19 1455 16     Temp 09/14/19 1455 99 F (37.2 C)     Temp Source 09/14/19 1455 Oral     SpO2 09/14/19 1455 97 %     Weight 09/14/19 1456  199 lb (90.3 kg)     Height 09/14/19 1456 5\' 11"  (1.803 m)     Head Circumference --      Peak Flow --      Pain Score 09/14/19 1456 3     Pain Loc --      Pain Edu? --      Excl. in [place]? --     Constitutional: Alert and oriented. Well appearing and in no acute distress. Eyes: Conjunctivae are normal. EOMI. Head: Atraumatic. Nose: No congestion/rhinnorhea. Mouth/Throat: Mucous membranes are moist.   Neck: No stridor. Trachea Midline. FROM Cardiovascular: Normal rate, regular rhythm. Grossly normal heart sounds.  Good peripheral circulation. Respiratory: Normal respiratory effort.  No retractions. Lungs CTAB. Gastrointestinal: Soft very slight epigastric tenderness without rebound or guarding. No distention. No abdominal bruits.  Musculoskeletal: No lower extremity tenderness nor edema.  No joint effusions. Neurologic:  Normal speech and language. No gross focal neurologic deficits are appreciated.  Skin:  Skin is warm, dry and intact. No rash noted. Psychiatric: Mood and affect are normal. Speech and behavior are normal. GU: Deferred   ____________________________________________   LABS (all labs ordered are listed, but only abnormal results are displayed)  Labs Reviewed  COMPREHENSIVE METABOLIC PANEL - Abnormal; Notable for the following components:      Result Value   Total Protein 8.2 (*)    Total Bilirubin 1.4 (*)    All other components within normal limits  CBC  LIPASE, BLOOD  TROPONIN I (HIGH SENSITIVITY)   ____________________________________________   ED ECG REPORT I, Vanessa Fiskdale, the attending physician, personally viewed and interpreted this ECG.  Normal sinus rate of 88, no ST elvation, no T wave inversions, left anterior fascicular block ____________________________________________  RADIOLOGY I, Vanessa Milford, personally viewed and evaluated these images (plain radiographs) as part of my medical decision making, as well as reviewing the written report by  the radiologist.  ED MD interpretation: No pneumonia or widen mediastinum  Official radiology report(s): DG Chest 2 View  Result Date: 09/14/2019 CLINICAL DATA:  Chest pain EXAM: CHEST - 2 VIEW COMPARISON:  08/26/2018 FINDINGS: The heart size and mediastinal contours are within normal limits. Both lungs are clear. The visualized skeletal structures are unremarkable. IMPRESSION: No active cardiopulmonary disease. Electronically Signed   By: Inez Catalina M.D.   On: 09/14/2019 15:28    ____________________________________________   PROCEDURES  Procedure(s) performed (including Critical Care):  Procedures   ____________________________________________   INITIAL IMPRESSION / ASSESSMENT AND PLAN / ED COURSE  Illya Hangartner. was evaluated in Emergency Department on 09/14/2019 for the symptoms described in the history of present illness. He was evaluated in the context of the global COVID-19 pandemic, which necessitated consideration that the patient might be at risk for infection with the SARS-CoV-2 virus that causes COVID-19. Institutional protocols and algorithms that pertain to the evaluation of patients at risk for COVID-19 are in a state of rapid change based on  information released by regulatory bodies including the CDC and federal and state organizations. These policies and algorithms were followed during the patient's care in the ED.    Patient is a well-appearing 54 year old who comes in with some sharp stabbing chest pain on the right side of his chest.  Patient wanted make sure this was not related to his heart.  EKG and cardiac markers were obtained.  Patient states that his pain in his abdomen is his baseline pain.  No lower abdominal pain to suggest acute pathology such as obstruction, appendicitis, diverticulitis.  He states that this is typical for his acid reflux.  Has had prior imaging has not shown any gallstones.  Patient states he is not concerned about this does not want  further work-up for this given this is his baseline.  Lipase is normal No evidence of anemia T bili slightly elevated but is been elevated in the past. Troponin is 3 heart score is less than 3 and patient symptoms started greater than 3 hours ago  Discussed with patient he stated that his pain definitely began  before 11:00 which makes this at least 4 hours out therefore given the atypical sounding chest pain this will come out for ACS.  Discussed with patient and he feels comfortable going home at this time.  I offered him referral for cardiology forstress test although he seems to be pretty active without any exertional chest pain.  States he would prefer just to follow with his primary care by Dr.  Discussed that his blood pressure was slightly elevated he should have this rechecked with his primary care doctor in 1 week    ____________________________________________   FINAL CLINICAL IMPRESSION(S) / ED DIAGNOSES   Final diagnoses:  Chest pain, unspecified type      MEDICATIONS GIVEN DURING THIS VISIT:  Medications - No data to display   ED Discharge Orders    None       Note:  This document was prepared using Dragon voice recognition software and may include unintentional dictation errors.   Vanessa Tamarac, MD 09/14/19 [phone]

## 2019-09-15 NOTE — Telephone Encounter (Signed)
Per EMR, patient was evaluated at ER and discharged home.

## 2019-11-14 ENCOUNTER — Other Ambulatory Visit: Payer: Self-pay | Admitting: Family Medicine

## 2019-11-14 ENCOUNTER — Other Ambulatory Visit: Payer: Self-pay

## 2019-11-14 DIAGNOSIS — F419 Anxiety disorder, unspecified: Secondary | ICD-10-CM

## 2019-11-14 DIAGNOSIS — I10 Essential (primary) hypertension: Secondary | ICD-10-CM

## 2019-11-14 MED ORDER — LISINOPRIL-HYDROCHLOROTHIAZIDE 10-12.5 MG PO TABS: 1.0000 | ORAL_TABLET | Freq: Every day | ORAL | 0 refills | Status: DC

## 2019-11-14 MED ORDER — CITALOPRAM HYDROBROMIDE 20 MG PO TABS: 20.0000 mg | ORAL_TABLET | Freq: Every day | ORAL | 0 refills | Status: DC

## 2019-11-14 MED ORDER — LISINOPRIL-HYDROCHLOROTHIAZIDE 10-12.5 MG PO TABS: 1.0000 | ORAL_TABLET | Freq: Every day | ORAL | 3 refills | Status: DC

## 2019-11-14 MED ORDER — CITALOPRAM HYDROBROMIDE 20 MG PO TABS: 20.0000 mg | ORAL_TABLET | Freq: Every day | ORAL | 3 refills | Status: DC

## 2019-11-14 NOTE — Telephone Encounter (Signed)
Pt left v/m that he is requesting # 10 on citalopram 20 mg and lisinopril-HCTZ 10-12.5 mg to [facility]. Pt also request 90 day rx for citalopram 20 mg and lisinopril-HCTZ 10-12.5 mg taking one daily on both medications.Pt established care with Glenda Chroman FNP on 08/31/19. Pt request cb. I spoke with pt and advised since Glenda Chroman FNP has not filled meds before will send to her for refills; pt said when established was not aware had gotten low on these 2 meds. Pt is out of lisiniopril HCTZ 10-125 mg. Please advise.

## 2019-11-14 NOTE — Telephone Encounter (Signed)
Two week supply of both meds sent to [facility], 90 day supply with refills sent to [facility].

## 2019-11-14 NOTE — Telephone Encounter (Signed)
Per DPR left detailed v/m that refills for lisinopril - HCTZ 10-12.5 mg # 10 sent to [facility] and also #90 x 3 to [facility] and citalopram 20 mg # 10 to [facility] and # 90 x 3 to [facility] as pt had requested.

## 2019-11-15 ENCOUNTER — Telehealth: Payer: Self-pay

## 2019-11-15 NOTE — Telephone Encounter (Signed)
Pt has been having shoulder pain for several days. Michela Pitcher he is using a Scientist, research (life sciences) that helps a little. Was asking if he could get a muscle relaxer. I advised he needed an OV for that. Debbie's schedule is full tomorrow and she is out next week. I offered an appt with Dr Lorelei Pont next week. He said if he is not better by next week, he may go to the [facility].

## 2019-11-19 ENCOUNTER — Encounter: Payer: Self-pay | Admitting: Emergency Medicine

## 2019-11-19 ENCOUNTER — Other Ambulatory Visit: Payer: Self-pay

## 2019-11-19 ENCOUNTER — Telehealth: Payer: Self-pay | Admitting: *Deleted

## 2019-11-19 ENCOUNTER — Ambulatory Visit
Admission: EM | Admit: 2019-11-19 | Discharge: 2019-11-19 | Disposition: A | Discharge status: Home / Self Care | Payer: BC Managed Care – PPO | Attending: Family Medicine | Admitting: Family Medicine

## 2019-11-19 DIAGNOSIS — E781 Pure hyperglyceridemia: Secondary | ICD-10-CM | POA: Diagnosis not present

## 2019-11-19 DIAGNOSIS — Z720 Tobacco use: Secondary | ICD-10-CM | POA: Diagnosis not present

## 2019-11-19 DIAGNOSIS — R509 Fever, unspecified: Secondary | ICD-10-CM | POA: Diagnosis not present

## 2019-11-19 DIAGNOSIS — K219 Gastro-esophageal reflux disease without esophagitis: Secondary | ICD-10-CM | POA: Insufficient documentation

## 2019-11-19 DIAGNOSIS — F419 Anxiety disorder, unspecified: Secondary | ICD-10-CM | POA: Insufficient documentation

## 2019-11-19 DIAGNOSIS — R05 Cough: Secondary | ICD-10-CM | POA: Diagnosis not present

## 2019-11-19 DIAGNOSIS — Z79899 Other long term (current) drug therapy: Secondary | ICD-10-CM | POA: Diagnosis not present

## 2019-11-19 DIAGNOSIS — B349 Viral infection, unspecified: Secondary | ICD-10-CM | POA: Diagnosis not present

## 2019-11-19 DIAGNOSIS — Z7901 Long term (current) use of anticoagulants: Secondary | ICD-10-CM | POA: Diagnosis not present

## 2019-11-19 DIAGNOSIS — I10 Essential (primary) hypertension: Secondary | ICD-10-CM | POA: Diagnosis not present

## 2019-11-19 DIAGNOSIS — U071 COVID-19: Secondary | ICD-10-CM | POA: Insufficient documentation

## 2019-11-19 NOTE — ED Provider Notes (Signed)
[facility]    CSN: [id number] Arrival date & time: 11/19/19  1003      History   Chief Complaint Chief Complaint  Patient presents with  . Cough  . Generalized Body Aches  . Fever    HPI Benigno Check. is a 54 y.o. male.   53 yo male with a c/o cough, bodyaches, congestion and fevers since yesterday. Denies any chest pains, shortness of breath, loss of taste or smell. No known sick contacts. States he has not received the covid vaccine.  Has been taking tylenol.   Cough Associated symptoms: fever   Fever Associated symptoms: cough     Past Medical History:  Diagnosis Date  . Anxiety   . GERD (gastroesophageal reflux disease)   . Hypertension     Patient Active Problem List   Diagnosis Date Noted  . Tobacco use 09/01/2019  . Anxiety 08/31/2019  . GERD (gastroesophageal reflux disease) 08/31/2019  . HTN (hypertension) 08/31/2019  . Hypertriglyceridemia 08/31/2019    Past Surgical History:  Procedure Laterality Date  . COLONOSCOPY WITH PROPOFOL N/A 10/20/2018   Procedure: COLONOSCOPY WITH PROPOFOL;  Surgeon: Lollie Sails, MD;  Location: [facility] ENDOSCOPY;  Service: Endoscopy;  Laterality: N/A;  . ESOPHAGOGASTRODUODENOSCOPY (EGD) WITH PROPOFOL N/A 10/20/2018   Procedure: ESOPHAGOGASTRODUODENOSCOPY (EGD) WITH PROPOFOL;  Surgeon: Lollie Sails, MD;  Location: [facility] ENDOSCOPY;  Service: Endoscopy;  Laterality: N/A;       Home Medications    Prior to Admission medications   Medication Sig Start Date End Date Taking? Authorizing Provider  citalopram (CELEXA) 20 MG tablet Take 1 tablet (20 mg total) by mouth daily. 11/14/19  Yes Elby Beck, FNP  lisinopril-hydrochlorothiazide (ZESTORETIC) 10-12.5 MG tablet Take 1 tablet by mouth daily. 11/14/19  Yes Elby Beck, FNP  Multiple Vitamins-Minerals (MULTIVITAMIN ADULT) TABS Take 1 tablet by mouth daily.   Yes [provider]  omeprazole (PRILOSEC) 40 MG capsule Take 40 mg  by mouth daily.   Yes [provider]  simvastatin (ZOCOR) 20 MG tablet Take 20 mg by mouth at bedtime.    Yes [provider]    Family History Family History  Problem Relation Age of Onset  . Cancer Mother   . Heart failure Father     Social History Social History   Tobacco Use  . Smoking status: Never Smoker  . Smokeless tobacco: Former Network engineer  . Vaping Use: Never used  Substance Use Topics  . Alcohol use: No  . Drug use: No     Allergies   Patient has no known allergies.   Review of Systems Review of Systems  Constitutional: Positive for fever.  Respiratory: Positive for cough.      Physical Exam Triage Vital Signs ED Triage Vitals  Enc Vitals Group     BP 11/19/19 1100 108/83     Pulse Rate 11/19/19 1100 72     Resp 11/19/19 1100 18     Temp 11/19/19 1100 98.7 F (37.1 C)     Temp Source 11/19/19 1100 Oral     SpO2 11/19/19 1100 97 %     Weight 11/19/19 1058 190 lb (86.2 kg)     Height 11/19/19 1058 5\' 11"  (1.803 m)     Head Circumference --      Peak Flow --      Pain Score 11/19/19 1058 0     Pain Loc --      Pain Edu? --  Excl. in [place]? --    No data found.  Updated Vital Signs BP 108/83 (BP Location: Right Arm)   Pulse 72   Temp 98.7 F (37.1 C) (Oral)   Resp 18   Ht 5\' 11"  (1.803 m)   Wt 86.2 kg   SpO2 97%   BMI 26.50 kg/m   Visual Acuity Right Eye Distance:   Left Eye Distance:   Bilateral Distance:    Right Eye Near:   Left Eye Near:    Bilateral Near:     Physical Exam Vitals and nursing note reviewed.  Constitutional:      General: He is not in acute distress.    Appearance: He is not toxic-appearing or diaphoretic.  HENT:     Right Ear: Tympanic membrane normal.     Left Ear: Tympanic membrane normal.     Nose: Congestion present.  Cardiovascular:     Rate and Rhythm: Normal rate and regular rhythm.     Heart sounds: Normal heart sounds.  Pulmonary:     Effort: Pulmonary effort is  normal. No respiratory distress.     Breath sounds: Normal breath sounds. No stridor. No wheezing, rhonchi or rales.  Musculoskeletal:     Cervical back: Neck supple.  Neurological:     Mental Status: He is alert.      UC Treatments / Results  Labs (all labs ordered are listed, but only abnormal results are displayed) Labs Reviewed  SARS CORONAVIRUS 2 (TAT 6-24 HRS)    EKG   Radiology No results found.  Procedures Procedures (including critical care time)  Medications Ordered in UC Medications - No data to display  Initial Impression / Assessment and Plan / UC Course  I have reviewed the triage vital signs and the nursing notes.  Pertinent labs & imaging results that were available during my care of the patient were reviewed by me and considered in my medical decision making (see chart for details).      Final Clinical Impressions(s) / UC Diagnoses   Final diagnoses:  Viral syndrome    ED Prescriptions    None      1. diagnosis reviewed with patient 2.. Recommend supportive treatment rest, fluids, otc medications prn 3. Follow-up prn if symptoms worsen or don't improve   PDMP not reviewed this encounter.   Norval Gable, MD 11/19/19 1143

## 2019-11-19 NOTE — Telephone Encounter (Signed)
Patient called stating that he started with a fever yesterday of 102.0. Patient stated that he feels like he has the flu. Patient stated that he has a dry cough, chills and body aches. Patient stated that he slept under 3 blankets last night and has been sweating real bad. Patient was advised unfortunately we can not bring him into the office with his symptoms. Patient was advised that he should be evaluated and tested for covid. Patient stated that he will go to [facility] to be seen now.

## 2019-11-19 NOTE — ED Triage Notes (Signed)
Patient c/o cough, body aches and fever that started yesterday.

## 2019-11-19 NOTE — Telephone Encounter (Signed)
Agree with evaluation and covid testing

## 2019-11-20 LAB — SARS CORONAVIRUS 2 (TAT 6-24 HRS): SARS Coronavirus 2: POSITIVE — AB

## 2019-11-21 ENCOUNTER — Telehealth: Payer: Self-pay | Admitting: Adult Health

## 2019-11-21 NOTE — Telephone Encounter (Signed)
Called to talk to patient and reivew his COVID positivity and treatment to reduce at risk groups complication and or hospitalization from the virus with monoclonal antibody infusion.  We discussed treatment and that he is in an at risk group due to his BMI, HTN.  We discussed the treatment and he does not want to receive it at this point.  Call back number given if he changes his mind.  [place], NP

## 2019-11-30 ENCOUNTER — Telehealth: Payer: Self-pay

## 2019-11-30 NOTE — Telephone Encounter (Signed)
Patient contacted the office and states that he was diagnosed with Covid on 11/19/19.  He states since then, he has lost 15 lbs - and he states he has absolutely no appetite,. He also states he has severe fatigue - and that he feels weak and his body just gives out very easy. He states he feels like he is drinking enough water, but is unable to  Eat any solid meals. He does still have a frequent dry cough and some nasal congestion. He denies any fever or shortness of breath. He also states that he has a rash all over his body that is very itchy - he is unsure if this because he gets so sweaty when he tries to do anything, or a sx of covid.  Patient is wondering what he can do - and states he is supposed to return to work next week, but with all of his sx, he is not sure if he should.  Debbie, there are no appts available in the office today. Do you want me to send patient for further triage - or do you have any recommendations?

## 2019-11-30 NOTE — Telephone Encounter (Signed)
Patient returned my call. No fever for several day, no SOB, no wheeze. Achy, tired, dry cough. Taking occasional acetaminophen. Discussed more assertive treatment of symptoms with otc cough suppressant, ibuprofen 400-600 mg every 8-12 hours, good hydration, focus on nutrient dense foods. Can follow up in office on Monday if no improvement. He will let me know if he is unable to go to work.

## 2019-11-30 NOTE — Telephone Encounter (Signed)
Attempted to call patient to further discuss symptoms. No answer. Will try again.

## 2020-01-12 DIAGNOSIS — R5381 Other malaise: Secondary | ICD-10-CM | POA: Diagnosis not present

## 2020-01-12 DIAGNOSIS — R52 Pain, unspecified: Secondary | ICD-10-CM | POA: Diagnosis not present

## 2020-02-25 ENCOUNTER — Telehealth: Payer: Self-pay

## 2020-02-25 ENCOUNTER — Telehealth: Payer: Self-pay | Admitting: *Deleted

## 2020-02-25 NOTE — Telephone Encounter (Signed)
Patient notified as instructed by telephone and verbalized understanding. 

## 2020-02-25 NOTE — Telephone Encounter (Signed)
Patient called stating that he has a toothache and can not get in to see his dentist until next week when he gets paid. Patient stated that his girlfriend had some Tramadol and he took a couple which is the only thing that has helped with the pain. Patient wants to know if you will send him in a few Tramadol to last him until he can get in to see his dentist? [facility]

## 2020-02-25 NOTE — Telephone Encounter (Signed)
Pt calling for refills on citalopram and Lisinopril HCTZ to [facility]. I advised pt one yrs refill was given on each med 11/14/19. Pt voiced understanding and will contact [facility]. Nothing further needed at this time.

## 2020-02-25 NOTE — Telephone Encounter (Signed)
Please call patient and tell him that I am not able to prescribe controlled substances without an office visit.Marland Kitchen  He could try alternating over-the-counter ibuprofen 2 to 3 tablets with acetaminophen 2 tablets every 4 hours.  That would be taking each medication every 8 hours.

## 2020-03-19 ENCOUNTER — Encounter: Payer: Self-pay | Admitting: Emergency Medicine

## 2020-03-19 ENCOUNTER — Ambulatory Visit (INDEPENDENT_AMBULATORY_CARE_PROVIDER_SITE_OTHER)
Admission: EM | Admit: 2020-03-19 | Discharge: 2020-03-19 | Disposition: A | Discharge status: Home / Self Care | Payer: BC Managed Care – PPO | Source: Home / Self Care

## 2020-03-19 ENCOUNTER — Telehealth: Payer: Self-pay | Admitting: *Deleted

## 2020-03-19 ENCOUNTER — Other Ambulatory Visit: Payer: Self-pay

## 2020-03-19 ENCOUNTER — Emergency Department
Admission: EM | Admit: 2020-03-19 | Discharge: 2020-03-19 | Disposition: A | Discharge status: Home / Self Care | Payer: BC Managed Care – PPO | Attending: Emergency Medicine | Admitting: Emergency Medicine

## 2020-03-19 DIAGNOSIS — K219 Gastro-esophageal reflux disease without esophagitis: Secondary | ICD-10-CM | POA: Diagnosis not present

## 2020-03-19 DIAGNOSIS — R079 Chest pain, unspecified: Secondary | ICD-10-CM

## 2020-03-19 DIAGNOSIS — R1013 Epigastric pain: Secondary | ICD-10-CM | POA: Diagnosis not present

## 2020-03-19 DIAGNOSIS — Z79899 Other long term (current) drug therapy: Secondary | ICD-10-CM | POA: Insufficient documentation

## 2020-03-19 DIAGNOSIS — I10 Essential (primary) hypertension: Secondary | ICD-10-CM | POA: Insufficient documentation

## 2020-03-19 DIAGNOSIS — F419 Anxiety disorder, unspecified: Secondary | ICD-10-CM | POA: Insufficient documentation

## 2020-03-19 LAB — COMPREHENSIVE METABOLIC PANEL
ALT: 33 U/L (ref 0–44)
AST: 41 U/L (ref 15–41)
Albumin: 4.5 g/dL (ref 3.5–5.0)
Alkaline Phosphatase: 67 U/L (ref 38–126)
Anion gap: 10 (ref 5–15)
BUN: 12 mg/dL (ref 6–20)
CO2: 27 mmol/L (ref 22–32)
Calcium: 9.4 mg/dL (ref 8.9–10.3)
Chloride: 97 mmol/L — ABNORMAL LOW (ref 98–111)
Creatinine, Ser: 1.02 mg/dL (ref 0.61–1.24)
GFR, Estimated: >60 mL/min (ref >60)
Glucose, Bld: 108 mg/dL — ABNORMAL HIGH (ref 70–99)
Potassium: 3.6 mmol/L (ref 3.5–5.1)
Sodium: 134 mmol/L — ABNORMAL LOW (ref 135–145)
Total Bilirubin: 1.9 mg/dL — ABNORMAL HIGH (ref 0.3–1.2)
Total Protein: 8.5 g/dL — ABNORMAL HIGH (ref 6.5–8.1)

## 2020-03-19 LAB — CBC WITH DIFFERENTIAL/PLATELET
Abs Immature Granulocytes: 0.05 10*3/uL (ref 0.00–0.07)
Basophils Absolute: 0 10*3/uL (ref 0.0–0.1)
Basophils Relative: 1 %
Eosinophils Absolute: 0.1 10*3/uL (ref 0.0–0.5)
Eosinophils Relative: 1 %
HCT: 43.4 % (ref 39.0–52.0)
Hemoglobin: 15.6 g/dL (ref 13.0–17.0)
Immature Granulocytes: 1 %
Lymphocytes Relative: 18 %
Lymphs Abs: 1.5 10*3/uL (ref 0.7–4.0)
MCH: 34.4 pg — ABNORMAL HIGH (ref 26.0–34.0)
MCHC: 35.9 g/dL (ref 30.0–36.0)
MCV: 95.8 fL (ref 80.0–100.0)
Monocytes Absolute: 0.8 10*3/uL (ref 0.1–1.0)
Monocytes Relative: 10 %
Neutro Abs: 5.5 10*3/uL (ref 1.7–7.7)
Neutrophils Relative %: 69 %
Platelets: 359 10*3/uL (ref 150–400)
RBC: 4.53 MIL/uL (ref 4.22–5.81)
RDW: 12.3 % (ref 11.5–15.5)
WBC: 7.9 10*3/uL (ref 4.0–10.5)
nRBC: 0 % (ref 0.0–0.2)

## 2020-03-19 LAB — TROPONIN I (HIGH SENSITIVITY)
Troponin I (High Sensitivity): 4 ng/L (ref <18)
Troponin I (High Sensitivity): 4 ng/L (ref <18)

## 2020-03-19 MED ORDER — LIDOCAINE VISCOUS HCL 2 % MT SOLN
15.0000 mL | Freq: Once | OROMUCOSAL | Status: AC
  Administered 2020-03-19: 15 mL via ORAL

## 2020-03-19 MED ORDER — ALUM & MAG HYDROXIDE-SIMETH 200-200-20 MG/5ML PO SUSP
30.0000 mL | Freq: Once | ORAL | Status: AC
  Administered 2020-03-19: 30 mL via ORAL

## 2020-03-19 NOTE — Telephone Encounter (Signed)
Noted. Per EMR, patient presented to urgent care.

## 2020-03-19 NOTE — ED Notes (Signed)
Pt presents to [facility] area with no complaints other than "I am waiting to be discharged I was told it wasn't my heart". Pt denies chest pain at this time. Pt seems anxious at this time and states a HX of anxiety. Pt is A&Ox4.

## 2020-03-19 NOTE — Discharge Instructions (Addendum)
As per discussion we will refer you to cardiology.  They will reach out to you to schedule an appointment.  You need to resume your simvastatin for your cholesterol.  If your chest pain returns you need to go to the ER for evaluation.

## 2020-03-19 NOTE — ED Notes (Signed)
Per MD Ellender Hose ok to be seen in flex

## 2020-03-19 NOTE — Telephone Encounter (Signed)
Patient called stating that he had an episode at work today with chest pain. Patient stated that he had chest pain for about 30 minutes. Patient stated that EMS came out and did an EKG and they told him it looked fine, but he needed to go get some lab work done. Patient stated that he walked into the UC next to [facility] and told them he needed lab work and they told him that he would need a work up done first so he left. Patient stated that his blood pressure was up when EMS came out today. Patient stated that his chest still hurts a little. Patient stated that his stomach was hurting a little this morning also. Patient was advised that he should go to an UC to be evaluated. Patient stated that he will go to the [facility] in [place] and will head that way now. Patient was advised if his pain get worse before going to the UC to call 911 and he verbalized understanding.

## 2020-03-19 NOTE — ED Triage Notes (Signed)
Patient states that he had one episode of chest pain in center of chest that lasted for 15-20 mins. States that he called EMS and they stated that his 12 lead looked okay. Reports that he called his primary care to be seen and was unable to obtain an appointment so he came here. Would like bloodwork done. States that he has issues with his stomach often and has often mistaked this for chest pain and ended up in the ED for panic attacks. Reports that he is not currently having any chest pain.

## 2020-03-19 NOTE — ED Provider Notes (Addendum)
[facility]    CSN: [id number] Arrival date & time: 03/19/20  2500      History   Chief Complaint Chief Complaint  Patient presents with  . Chest Pain    HPI Patrick Barajas. is a 54 y.o. male.   HPI   54 year old male here for evaluation of chest pain.  Patient reports that this morning he had an episode of sharp, stabbing, substernal chest pain that did not radiate that lasted for 20 minutes.  Patient reports that he called EMS who came, did an EKG, said looks fine, and offered the patient transport to the ER.  Patient declined transport because he went to follow-up with his primary care doctor.  Patient reports he contacted his PCP who could not see him and was advised to come to the urgent care.  Patient denies associated shortness of breath, sweating, nausea, or radiation.  Patient states that his pain is completely resolved and now he just has what he calls his normal epigastric pain.  Also has a history of hyperlipidemia but reports that he is not taking his statin in over a year.  Patient reports that his blood work was "good" so he just stopped without telling anybody.  Patient reports that his chest pain started around 8 AM.  Past Medical History:  Diagnosis Date  . Anxiety   . GERD (gastroesophageal reflux disease)   . Hypertension     Patient Active Problem List   Diagnosis Date Noted  . Tobacco use 09/01/2019  . Anxiety 08/31/2019  . GERD (gastroesophageal reflux disease) 08/31/2019  . HTN (hypertension) 08/31/2019  . Hypertriglyceridemia 08/31/2019    Past Surgical History:  Procedure Laterality Date  . COLONOSCOPY WITH PROPOFOL N/A 10/20/2018   Procedure: COLONOSCOPY WITH PROPOFOL;  Surgeon: Lollie Sails, MD;  Location: [facility] ENDOSCOPY;  Service: Endoscopy;  Laterality: N/A;  . ESOPHAGOGASTRODUODENOSCOPY (EGD) WITH PROPOFOL N/A 10/20/2018   Procedure: ESOPHAGOGASTRODUODENOSCOPY (EGD) WITH PROPOFOL;  Surgeon: Lollie Sails, MD;   Location: [facility] ENDOSCOPY;  Service: Endoscopy;  Laterality: N/A;       Home Medications    Prior to Admission medications   Medication Sig Start Date End Date Taking? Authorizing Provider  citalopram (CELEXA) 20 MG tablet Take 1 tablet (20 mg total) by mouth daily. 11/14/19  Yes Elby Beck, FNP  lisinopril-hydrochlorothiazide (ZESTORETIC) 10-12.5 MG tablet Take 1 tablet by mouth daily. 11/14/19  Yes Elby Beck, FNP  Multiple Vitamins-Minerals (MULTIVITAMIN ADULT) TABS Take 1 tablet by mouth daily.   Yes [provider]  omeprazole (PRILOSEC) 40 MG capsule Take 40 mg by mouth daily.   Yes [provider]  simvastatin (ZOCOR) 20 MG tablet Take 20 mg by mouth at bedtime.   03/19/20  [provider]    Family History Family History  Problem Relation Age of Onset  . Cancer Mother   . Heart failure Father     Social History Social History   Tobacco Use  . Smoking status: Never Smoker  . Smokeless tobacco: Current User    Types: Snuff  Vaping Use  . Vaping Use: Never used  Substance Use Topics  . Alcohol use: Yes    Comment: occasionally  . Drug use: No     Allergies   Patient has no known allergies.   Review of Systems Review of Systems  Constitutional: Negative for activity change, appetite change and fever.  HENT: Negative for congestion and sore throat.   Respiratory: Negative for cough,  shortness of breath and wheezing.   Cardiovascular: Positive for chest pain. Negative for palpitations.  Gastrointestinal: Negative for diarrhea, nausea and vomiting.  Musculoskeletal: Negative for neck pain.  Skin: Negative for rash.  Neurological: Negative for dizziness and syncope.  Hematological: Negative.   Psychiatric/Behavioral: Negative.      Physical Exam Triage Vital Signs ED Triage Vitals  Enc Vitals Group     BP      Pulse      Resp      Temp      Temp src      SpO2      Weight      Height      Head Circumference       Peak Flow      Pain Score      Pain Loc      Pain Edu?      Excl. in [place]?    No data found.  Updated Vital Signs BP 139/86 (BP Location: Left Arm)   Pulse 91   Temp 98.5 F (36.9 C) (Oral)   Resp 18   Ht 5\' 11"  (1.803 m)   Wt 195 lb (88.5 kg)   SpO2 98%   BMI 27.20 kg/m   Visual Acuity Right Eye Distance:   Left Eye Distance:   Bilateral Distance:    Right Eye Near:   Left Eye Near:    Bilateral Near:     Physical Exam Vitals and nursing note reviewed.  Constitutional:      General: He is not in acute distress.    Appearance: He is well-developed and normal weight. He is not toxic-appearing.  HENT:     Head: Normocephalic and atraumatic.  Eyes:     Extraocular Movements: Extraocular movements intact.     Pupils: Pupils are equal, round, and reactive to light.  Neck:     Vascular: No JVD.  Cardiovascular:     Rate and Rhythm: Normal rate and regular rhythm.     Pulses:          Carotid pulses are 2+ on the right side and 2+ on the left side.      Radial pulses are 2+ on the right side and 2+ on the left side.       Dorsalis pedis pulses are 2+ on the right side and 2+ on the left side.       Posterior tibial pulses are 2+ on the right side and 2+ on the left side.     Heart sounds: Normal heart sounds. No murmur heard.  No friction rub.  Pulmonary:     Effort: Pulmonary effort is normal. No respiratory distress.     Breath sounds: Normal breath sounds. No decreased breath sounds, wheezing, rhonchi or rales.  Chest:     Chest wall: No deformity or tenderness.  Musculoskeletal:        General: Normal range of motion.     Cervical back: Normal range of motion and neck supple.     Right lower leg: No tenderness.     Left lower leg: No tenderness. No edema.  Lymphadenopathy:     Cervical: No cervical adenopathy.  Skin:    General: Skin is warm and dry.     Capillary Refill: Capillary refill takes less than 2 seconds.     Findings: No erythema or rash.    Neurological:     General: No focal deficit present.     Mental Status: He is alert  and oriented to person, place, and time.     Motor: No weakness.  Psychiatric:        Mood and Affect: Mood normal.        Behavior: Behavior normal.      UC Treatments / Results  Labs (all labs ordered are listed, but only abnormal results are displayed) Labs Reviewed  CBC WITH DIFFERENTIAL/PLATELET - Abnormal; Notable for the following components:      Result Value   MCH 34.4 (*)    All other components within normal limits  COMPREHENSIVE METABOLIC PANEL - Abnormal; Notable for the following components:   Sodium 134 (*)    Chloride 97 (*)    Glucose, Bld 108 (*)    Total Protein 8.5 (*)    Total Bilirubin 1.9 (*)    All other components within normal limits  TROPONIN I (HIGH SENSITIVITY)    EKG   Radiology No results found.  Procedures Procedures (including critical care time)  Medications Ordered in UC Medications  alum & mag hydroxide-simeth (MAALOX/MYLANTA) 200-200-20 MG/5ML suspension 30 mL (30 mLs Oral Given 03/19/20 1035)    And  lidocaine (XYLOCAINE) 2 % viscous mouth solution 15 mL (15 mLs Oral Given 03/19/20 1035)    Initial Impression / Assessment and Plan / UC Course  I have reviewed the triage vital signs and the nursing notes.  Pertinent labs & imaging results that were available during my care of the patient were reviewed by me and considered in my medical decision making (see chart for details).   Patient is here for evaluation of substernal chest pain that is resolved.  Patient states that his pain was described as "stabbing" in the middle of his chest, lasted 20 minutes, and resolved.  Patient reports that he was evaluated by EMS at home.  He reports they did an EKG, said it was fine, and offered him transport to the ER.  Patient declined, called his PCP and his PCP advised him to come here.  Patient has a history of anxiety and GERD.  Patient reports that he now  has his normal epigastric pain that is typical of his GERD.  Patient was evaluated in the ER on 14 Sep 2019 had a negative troponin and declined referral to cardiology at that time.  Today patient's EKG shows T wave inversions in V3, flattening in V4, increased T wave depression in V1 as compared to Sep 14, 2019 EKG.  Patient is in no acute distress at this time, will give GI cocktail to see if that helps with the epigastric pain, check CBC, CMP, and troponin.  Patient has not been on a statin for over a year.  Blood work from Aug 31, 2019 shows a total cholesterol of 218, LDL 111, triglycerides 319, non-HDL 177.87.  CBC unremarkable  CMP relatively unchanged as compared to May. Creatinine is 1.02.  Troponin is 4  Given patient's past medical history and current lab values his heart score is 6. This gives him a risk of major cardiac event at 12 to 16.6%.  She reports that his epigastric pain completely resolved with GI cocktail.  He also states that he is feeling fine now, no chest pain, no shortness of breath.  Patient advised that he needs to restart his cholesterol medication, if his chest pain returns he needs to go to the ER, and I am referring him to cardiology for evaluation.  When patient was being discharged he stated that he was starting to have return  of his substernal chest pain.  Patient advised that he needed to have an ER evaluation.  Patient opted to leave and go to the ER via POV.   Final Clinical Impressions(s) / UC Diagnoses   Final diagnoses:  Chest pain, unspecified type     Discharge Instructions     As per discussion we will refer you to cardiology.  They will reach out to you to schedule an appointment.  You need to resume your simvastatin for your cholesterol.  If your chest pain returns you need to go to the ER for evaluation.    ED Prescriptions    None     PDMP not reviewed this encounter.   Margarette Canada, NP 03/19/20 1150    Margarette Canada,  NP 03/19/20 1153

## 2020-03-19 NOTE — Discharge Instructions (Addendum)
Your cardiac work-up was unremarkable.  It is no serial marked relief with a GI cocktail.  Continue previous medications and follow-up with PCP.

## 2020-03-19 NOTE — ED Triage Notes (Signed)
Pt comes into the ED via POV c/o chest pain centrally.  Pt was seen at the UC where they completed a full cardiac workup which was negative, but as the patient was walking out of the facility, he had more chest pain occur. Pt also states the pain is upper epigastric/chest pain.  Denies any radiation, N/V/D/ or diaphoresis.  Pt denies any cardiac history and in NAD with even and unlabored respirations.

## 2020-03-19 NOTE — ED Notes (Signed)
D/C and f/up discussed with pt, pt verbalized understanding. No distress noted.

## 2020-03-19 NOTE — ED Provider Notes (Signed)
[facility] Emergency Department Provider Note   ____________________________________________   First MD Initiated Contact with Patient 03/19/20 1438     (approximate)  I have reviewed the triage vital signs and the nursing notes.   HISTORY  Chief Complaint Chest Pain    HPI Patrick Barajas. is a 54 y.o. male patient complain of chest pain.  [facility] had a complete cardiac work-up with no acute findings.  Patient stated he started feeling better and will start to leave the pain reoccurred.  Patient the pain is in epigastric/chest area.  Denies any radiation of the pain.  Denies diaphoresis, nausea, vomiting, diarrhea.  Patient denies any cardiac history.  Patient has a history of GERD.  Patient was able to drive from the urgent care clinic to the emergency room for reevaluation.  Patient said he was given a GI cocktail prior to leaving urgent care clinic.  Patient admits that he is feeling a lot better at this time.  Further history shows the patient has history of anxiety and hypertension.  Patient admits to noncompliance of medication for GERD consisting of Prilosec.  Patient also states that he was late for his prescription for Celexa.     Past Medical History:  Diagnosis Date  . Anxiety   . GERD (gastroesophageal reflux disease)   . Hypertension     Patient Active Problem List   Diagnosis Date Noted  . Tobacco use 09/01/2019  . Anxiety 08/31/2019  . GERD (gastroesophageal reflux disease) 08/31/2019  . HTN (hypertension) 08/31/2019  . Hypertriglyceridemia 08/31/2019    Past Surgical History:  Procedure Laterality Date  . COLONOSCOPY WITH PROPOFOL N/A 10/20/2018   Procedure: COLONOSCOPY WITH PROPOFOL;  Surgeon: Lollie Sails, MD;  Location: [facility] ENDOSCOPY;  Service: Endoscopy;  Laterality: N/A;  . ESOPHAGOGASTRODUODENOSCOPY (EGD) WITH PROPOFOL N/A 10/20/2018   Procedure: ESOPHAGOGASTRODUODENOSCOPY  (EGD) WITH PROPOFOL;  Surgeon: Lollie Sails, MD;  Location: [facility] ENDOSCOPY;  Service: Endoscopy;  Laterality: N/A;    Prior to Admission medications   Medication Sig Start Date End Date Taking? Authorizing Provider  citalopram (CELEXA) 20 MG tablet Take 1 tablet (20 mg total) by mouth daily. 11/14/19   Elby Beck, FNP  lisinopril-hydrochlorothiazide (ZESTORETIC) 10-12.5 MG tablet Take 1 tablet by mouth daily. 11/14/19   Elby Beck, FNP  Multiple Vitamins-Minerals (MULTIVITAMIN ADULT) TABS Take 1 tablet by mouth daily.    [provider]  omeprazole (PRILOSEC) 40 MG capsule Take 40 mg by mouth daily.    [provider]  simvastatin (ZOCOR) 20 MG tablet Take 20 mg by mouth at bedtime.   03/19/20  [provider]    Allergies Patient has no known allergies.  Family History  Problem Relation Age of Onset  . Cancer Mother   . Heart failure Father     Social History Social History   Tobacco Use  . Smoking status: Never Smoker  . Smokeless tobacco: Current User    Types: Snuff  Vaping Use  . Vaping Use: Never used  Substance Use Topics  . Alcohol use: Yes    Comment: occasionally  . Drug use: No    Review of Systems Constitutional: No fever/chills Eyes: No visual changes. ENT: No sore throat. Cardiovascular: Denies chest pain. Respiratory: Denies shortness of breath. Gastrointestinal: No abdominal pain.  No nausea, no vomiting.  No diarrhea.  No constipation. Genitourinary: Negative for dysuria. Musculoskeletal: Negative for back pain. Skin:  Negative for rash. Neurological: Negative for headaches, focal weakness or numbness. Psychiatric:  Anxiety Endocrine:  Hyperlipidemia and hypertension   ____________________________________________   PHYSICAL EXAM:  VITAL SIGNS: ED Triage Vitals  Enc Vitals Group     BP 03/19/20 1224 (!) 148/92     Pulse Rate 03/19/20 1224 97     Resp 03/19/20 1444 18     Temp 03/19/20 1224 98.6  F (37 C)     Temp Source 03/19/20 1224 Oral     SpO2 03/19/20 1224 96 %     Weight 03/19/20 1220 195 lb (88.5 kg)     Height 03/19/20 1220 5\' 11"  (1.803 m)     Head Circumference --      Peak Flow --      Pain Score 03/19/20 1220 4     Pain Loc --      Pain Edu? --      Excl. in [place]? --    Constitutional: Alert and oriented. Well appearing and in no acute distress. Eyes: Conjunctivae are normal. PERRL. EOMI. Head: Atraumatic. Nose: No congestion/rhinnorhea. Mouth/Throat: Mucous membranes are moist.  Oropharynx non-erythematous. Neck: No stridor.  Hematological/Lymphatic/Immunilogical: No cervical lymphadenopathy. Cardiovascular: Normal rate, regular rhythm. Grossly normal heart sounds.  Good peripheral circulation. Respiratory: Normal respiratory effort.  No retractions. Lungs CTAB. Gastrointestinal: Soft and nontender. No distention. No abdominal bruits. No CVA tenderness. Genitourinary: Deferred Neurologic:  Normal speech and language. No gross focal neurologic deficits are appreciated. No gait instability. Skin:  Skin is warm, dry and intact. No rash noted. Psychiatric: Mood and affect are normal. Speech and behavior are normal.  ____________________________________________   LABS (all labs ordered are listed, but only abnormal results are displayed)  Labs Reviewed  TROPONIN I (HIGH SENSITIVITY)  TROPONIN I (HIGH SENSITIVITY)   ____________________________________________  EKG   ____________________________________________  RADIOLOGY I, Sable Feil, personally viewed and evaluated these images (plain radiographs) as part of my medical decision making, as well as reviewing the written report by the radiologist.  ED MD interpretation:    Official radiology report(s): No results found.  ____________________________________________   PROCEDURES  Procedure(s) performed (including Critical  Care):  Procedures   ____________________________________________   INITIAL IMPRESSION / ASSESSMENT AND PLAN / ED COURSE  As part of my medical decision making, I reviewed the following data within the [facility]         Patient presents with epigastric pain which is now improved status post GI cocktail.  Patient was concerned for cardiac event.  Reviewed lab results and EKG from urgent care clinic which were unremarkable.  Patient complaint physical exam is consistent with GERD with anxiety component.  Patient reassured no cardiac event and advised to continue previous medication follow-up PCP.  Return right ED if condition worsens.      ____________________________________________   FINAL CLINICAL IMPRESSION(S) / ED DIAGNOSES  Final diagnoses:  Gastroesophageal reflux disease without esophagitis  Anxiety     ED Discharge Orders    None      *Please note:  Patrick Barajas. was evaluated in Emergency Department on 03/19/2020 for the symptoms described in the history of present illness. He was evaluated in the context of the global COVID-19 pandemic, which necessitated consideration that the patient might be at risk for infection with the SARS-CoV-2 virus that causes COVID-19. Institutional protocols and algorithms that pertain to the evaluation of patients at risk for COVID-19 are in a state of rapid change based on information  released by regulatory bodies including the CDC and federal and state organizations. These policies and algorithms were followed during the patient's care in the ED.  Some ED evaluations and interventions may be delayed as a result of limited staffing during and the pandemic.*   Note:  This document was prepared using Dragon voice recognition software and may include unintentional dictation errors.    Sable Feil, PA-C 03/19/20 1510    Arta Silence, MD 03/19/20 [phone]

## 2020-05-26 ENCOUNTER — Telehealth: Payer: Self-pay | Admitting: *Deleted

## 2020-05-26 NOTE — Telephone Encounter (Signed)
Patient left a voicemail stating that he is having stomach pain and wants to know what he should take. Tried to call patient back and got his voicemail. Left a message for patient to call the office back.

## 2020-05-27 NOTE — Telephone Encounter (Signed)
Called patient and was advised that he didn't call back because he started feeling better. Patient stated to disregard the call. Patient was transferred to the front office to set up a TOC appointment since his PCP has left the practice.

## 2020-06-02 ENCOUNTER — Telehealth: Payer: Self-pay | Admitting: *Deleted

## 2020-06-02 NOTE — Telephone Encounter (Signed)
Aware, will see him then °

## 2020-06-02 NOTE — Telephone Encounter (Signed)
Patient left a voicemail stating that he needs a refill on a prescription. Tried to call patient back and got his voicemail. Left a message for patient to call back. We need to know the name of the medication and the pharmacy.

## 2020-06-02 NOTE — Telephone Encounter (Signed)
Patient called stating that he was previously given Indomethacin 50 mg for gout flare-ups. Patient stated that he is having a flare-up with gout that started yesterday with his big toe on his left foot. Patient stated that he had two Indomethacin pills on hand so he took them which helped. Patient stated today his toe is hurting again. Advised patient that Tor Netters NP has left the practice which he was aware of that. Advised patient that Indomethacin is no longer on his medication list. Patient stated that he has not gotten it in a while. Advised patient that he would need to be seen since it has been a while since he has gotten the medication. Patient scheduled to see Dr. Glori Bickers tomorrow 06/03/20 at 10:30 am. Patient has a [facility] appointment scheduled with Dr. Damita Dunnings 07/25/20.

## 2020-06-03 ENCOUNTER — Ambulatory Visit (INDEPENDENT_AMBULATORY_CARE_PROVIDER_SITE_OTHER): Payer: BC Managed Care – PPO | Admitting: Family Medicine

## 2020-06-03 ENCOUNTER — Encounter: Payer: Self-pay | Admitting: Family Medicine

## 2020-06-03 ENCOUNTER — Other Ambulatory Visit: Payer: Self-pay

## 2020-06-03 DIAGNOSIS — M109 Gout, unspecified: Secondary | ICD-10-CM | POA: Diagnosis not present

## 2020-06-03 MED ORDER — INDOMETHACIN 50 MG PO CAPS
50.0000 mg | ORAL_CAPSULE | Freq: Three times a day (TID) | ORAL | 1 refills | Status: DC | PRN
Start: 2020-06-03 — End: 2021-10-03

## 2020-06-03 NOTE — Patient Instructions (Addendum)
Take your indomethacin as needed for gout- always take with food  Stop it and let us know if stomach bothers you  Elevate  Cool compress if needed   If symptoms do not improve or if the worsen let us know  Keep up a good fluid intake   If this re occurs please also let us know

## 2020-06-03 NOTE — Assessment & Plan Note (Signed)
Gout flare in L great toe (this happens infrequently) in pt who takes lisinopril hct  Disc gout and diet and need for fluids If recurrent would consider stopping diuretic Handouts give re: gout and purines in diet  Px indomethacin 50 mg to take tid prn with meals until better  inst to call if GI upset  Update if not starting to improve in a week or if worsening  If more freq in the future-consider re check uric acid and proph tx like allopurinol

## 2020-06-03 NOTE — Progress Notes (Signed)
Subjective:    Patient ID: Patrick Barajas., male    DOB: December 12, 1965, 55 y.o.   MRN: [id number]  This visit occurred during the SARS-CoV-2 public health emergency.  Safety protocols were in place, including screening questions prior to the visit, additional usage of staff PPE, and extensive cleaning of exam room while observing appropriate contact time as indicated for disinfecting solutions.    HPI 55 yo pt of Dr Carlean Purl presents with possible gout flare up   Wt Readings from Last 3 Encounters:  06/03/20 197 lb 6 oz (89.5 kg)  03/19/20 195 lb (88.5 kg)  03/19/20 195 lb (88.5 kg)   27.53 kg/m  Few days ago gout in L great toe flared  Has been drinking gatorade    Big toe on left foot  Took indomethacin and ran out  (2 pills) - it did help  Did not get stomach pain - but he ate with them   No injury or trauma  Ate more steak recently since it was on sale   Has had gout on /off for years  Usually easy to treat  Watches diet  1-2 flares per year at the very most     H/o HTN  bp is stable today  No cp or palpitations or headaches or edema  No side effects to medicines  BP Readings from Last 3 Encounters:  06/03/20 122/68  03/19/20 (!) 148/92  03/19/20 139/86     Takes lisinopril hct 10-12.5 mg daily   Lab Results  Component Value Date   CREATININE 1.02 03/19/2020   BUN 12 03/19/2020   NA 134 (L) 03/19/2020   K 3.6 03/19/2020   CL 97 (L) 03/19/2020   CO2 27 03/19/2020   Lab Results  Component Value Date   LABURIC 8.5 (H) 07/18/2017    Patient Active Problem List   Diagnosis Date Noted  . Gout 06/03/2020  . Tobacco use 09/01/2019  . Anxiety 08/31/2019  . GERD (gastroesophageal reflux disease) 08/31/2019  . HTN (hypertension) 08/31/2019  . Hypertriglyceridemia 08/31/2019   Past Medical History:  Diagnosis Date  . Anxiety   . GERD (gastroesophageal reflux disease)   . Hypertension    Past Surgical History:  Procedure Laterality Date  .  COLONOSCOPY WITH PROPOFOL N/A 10/20/2018   Procedure: COLONOSCOPY WITH PROPOFOL;  Surgeon: Lollie Sails, MD;  Location: [facility] ENDOSCOPY;  Service: Endoscopy;  Laterality: N/A;  . ESOPHAGOGASTRODUODENOSCOPY (EGD) WITH PROPOFOL N/A 10/20/2018   Procedure: ESOPHAGOGASTRODUODENOSCOPY (EGD) WITH PROPOFOL;  Surgeon: Lollie Sails, MD;  Location: [facility] ENDOSCOPY;  Service: Endoscopy;  Laterality: N/A;   Social History   Tobacco Use  . Smoking status: Never Smoker  . Smokeless tobacco: Current User    Types: Snuff  Vaping Use  . Vaping Use: Never used  Substance Use Topics  . Alcohol use: Yes    Comment: occasionally  . Drug use: No   Family History  Problem Relation Age of Onset  . Cancer Mother   . Heart failure Father    No Known Allergies Current Outpatient Medications on File Prior to Visit  Medication Sig Dispense Refill  . citalopram (CELEXA) 20 MG tablet Take 1 tablet (20 mg total) by mouth daily. 90 tablet 3  . lisinopril-hydrochlorothiazide (ZESTORETIC) 10-12.5 MG tablet Take 1 tablet by mouth daily. 90 tablet 3  . Multiple Vitamins-Minerals (MULTIVITAMIN ADULT) TABS Take 1 tablet by mouth daily.    Marland Kitchen omeprazole (PRILOSEC) 40 MG capsule Take 40 mg by mouth  daily.    . [DISCONTINUED] simvastatin (ZOCOR) 20 MG tablet Take 20 mg by mouth at bedtime.      No current facility-administered medications on file prior to visit.    Review of Systems  Constitutional: Negative for activity change, appetite change, fatigue, fever and unexpected weight change.  HENT: Negative for congestion, rhinorrhea, sore throat and trouble swallowing.   Eyes: Negative for pain, redness, itching and visual disturbance.  Respiratory: Negative for cough, chest tightness, shortness of breath and wheezing.   Cardiovascular: Negative for chest pain and palpitations.  Gastrointestinal: Negative for abdominal pain, blood in stool, constipation, diarrhea and nausea.  Endocrine: Negative for cold  intolerance, heat intolerance, polydipsia and polyuria.  Genitourinary: Negative for difficulty urinating, dysuria, frequency and urgency.  Musculoskeletal: Positive for arthralgias. Negative for joint swelling and myalgias.  Skin: Negative for pallor and rash.  Neurological: Negative for dizziness, tremors, weakness, numbness and headaches.  Hematological: Negative for adenopathy. Does not bruise/bleed easily.  Psychiatric/Behavioral: Negative for decreased concentration and dysphoric mood. The patient is not nervous/anxious.        Objective:   Physical Exam Constitutional:      General: He is not in acute distress.    Appearance: Normal appearance. He is not ill-appearing.  Musculoskeletal:        General: Swelling and tenderness present.     Comments: L great toe First MTP joint is swollen and erythematous and warm  Tender to the touch Limited rom due to pain  Consistent with acute gout flare   Skin:    Findings: Erythema present.  Neurological:     Mental Status: He is alert.     Sensory: No sensory deficit.     Coordination: Coordination normal.  Psychiatric:        Mood and Affect: Mood normal.           Assessment & Plan:   Problem List Items Addressed This Visit      Other   Gout    Gout flare in L great toe (this happens infrequently) in pt who takes lisinopril hct  Disc gout and diet and need for fluids If recurrent would consider stopping diuretic Handouts give re: gout and purines in diet  Px indomethacin 50 mg to take tid prn with meals until better  inst to call if GI upset  Update if not starting to improve in a week or if worsening  If more freq in the future-consider re check uric acid and proph tx like allopurinol

## 2020-07-25 ENCOUNTER — Encounter: Payer: BC Managed Care – PPO | Admitting: Family Medicine

## 2020-08-09 ENCOUNTER — Other Ambulatory Visit: Payer: Self-pay

## 2020-08-09 ENCOUNTER — Emergency Department
Admission: EM | Admit: 2020-08-09 | Discharge: 2020-08-09 | Disposition: A | Discharge status: Home / Self Care | Payer: BC Managed Care – PPO | Attending: Emergency Medicine | Admitting: Emergency Medicine

## 2020-08-09 ENCOUNTER — Encounter: Payer: Self-pay | Admitting: Intensive Care

## 2020-08-09 DIAGNOSIS — R1013 Epigastric pain: Secondary | ICD-10-CM | POA: Diagnosis not present

## 2020-08-09 DIAGNOSIS — I10 Essential (primary) hypertension: Secondary | ICD-10-CM | POA: Diagnosis not present

## 2020-08-09 DIAGNOSIS — K219 Gastro-esophageal reflux disease without esophagitis: Secondary | ICD-10-CM | POA: Insufficient documentation

## 2020-08-09 DIAGNOSIS — Z79899 Other long term (current) drug therapy: Secondary | ICD-10-CM | POA: Diagnosis not present

## 2020-08-09 LAB — COMPREHENSIVE METABOLIC PANEL
ALT: 24 U/L (ref 0–44)
AST: 31 U/L (ref 15–41)
Albumin: 4.5 g/dL (ref 3.5–5.0)
Alkaline Phosphatase: 61 U/L (ref 38–126)
Anion gap: 10 (ref 5–15)
BUN: 13 mg/dL (ref 6–20)
CO2: 26 mmol/L (ref 22–32)
Calcium: 9.5 mg/dL (ref 8.9–10.3)
Chloride: 99 mmol/L (ref 98–111)
Creatinine, Ser: 0.92 mg/dL (ref 0.61–1.24)
GFR, Estimated: >60 mL/min (ref >60)
Glucose, Bld: 116 mg/dL — ABNORMAL HIGH (ref 70–99)
Potassium: 4 mmol/L (ref 3.5–5.1)
Sodium: 135 mmol/L (ref 135–145)
Total Bilirubin: 1.6 mg/dL — ABNORMAL HIGH (ref 0.3–1.2)
Total Protein: 8.2 g/dL — ABNORMAL HIGH (ref 6.5–8.1)

## 2020-08-09 LAB — CBC
HCT: 41.6 % (ref 39.0–52.0)
Hemoglobin: 14.9 g/dL (ref 13.0–17.0)
MCH: 32.9 pg (ref 26.0–34.0)
MCHC: 35.8 g/dL (ref 30.0–36.0)
MCV: 91.8 fL (ref 80.0–100.0)
Platelets: 415 10*3/uL — ABNORMAL HIGH (ref 150–400)
RBC: 4.53 MIL/uL (ref 4.22–5.81)
RDW: 11.7 % (ref 11.5–15.5)
WBC: 9.2 10*3/uL (ref 4.0–10.5)
nRBC: 0 % (ref 0.0–0.2)

## 2020-08-09 LAB — LIPASE, BLOOD: Lipase: 45 U/L (ref 11–51)

## 2020-08-09 LAB — TROPONIN I (HIGH SENSITIVITY): Troponin I (High Sensitivity): 3 ng/L (ref <18)

## 2020-08-09 MED ORDER — LIDOCAINE VISCOUS HCL 2 % MT SOLN
15.0000 mL | Freq: Once | OROMUCOSAL | Status: AC
  Administered 2020-08-09: 15 mL via ORAL
  Filled 2020-08-09: qty 15

## 2020-08-09 MED ORDER — ALUM & MAG HYDROXIDE-SIMETH 200-200-20 MG/5ML PO SUSP
30.0000 mL | Freq: Once | ORAL | Status: AC
  Administered 2020-08-09: 30 mL via ORAL
  Filled 2020-08-09: qty 30

## 2020-08-09 NOTE — ED Provider Notes (Signed)
[facility] Emergency Department Provider Note ____________________________________________  Time seen: 1220  I have reviewed the triage vital signs and the nursing notes.  HISTORY  Chief Complaint  Abdominal Pain  HPI Patrick Cuaresma. is a 55 y.o. male presents himself to the ED from work, for evaluation of epigastric pain.  Patient describes onset last night at about 8 PM.  He describes a constant dull pressure in the mid epigastrium.  He denies any referral to the neck back, or arms.  He also denies any associated nausea, vomiting, or diarrhea.  Patient with a history of hypertension and GERD, which is treated with omeprazole, presents for evaluation of his symptoms.  He was concerned for cardiac cause and presented himself to the ED for confirmation.  Patient has been recently on a course of colchicine and prednisone secondary to his initial flare of gout.  He would admit to taking the meds on an empty stomach, over the last few days.  Patient denies any associated diaphoresis, syncope, cough, or congestion.  He also denies any vomiting or the patient.   He does note soft stools which he expected with the colchicine he had been taking.  Past Medical History:  Diagnosis Date  . Anxiety   . GERD (gastroesophageal reflux disease)   . Hypertension     Patient Active Problem List   Diagnosis Date Noted  . Gout 06/03/2020  . Tobacco use 09/01/2019  . Anxiety 08/31/2019  . GERD (gastroesophageal reflux disease) 08/31/2019  . HTN (hypertension) 08/31/2019  . Hypertriglyceridemia 08/31/2019    Past Surgical History:  Procedure Laterality Date  . COLONOSCOPY WITH PROPOFOL N/A 10/20/2018   Procedure: COLONOSCOPY WITH PROPOFOL;  Surgeon: Lollie Sails, MD;  Location: [facility] ENDOSCOPY;  Service: Endoscopy;  Laterality: N/A;  . ESOPHAGOGASTRODUODENOSCOPY (EGD) WITH PROPOFOL N/A 10/20/2018   Procedure: ESOPHAGOGASTRODUODENOSCOPY (EGD) WITH PROPOFOL;  Surgeon:  Lollie Sails, MD;  Location: [facility] ENDOSCOPY;  Service: Endoscopy;  Laterality: N/A;    Prior to Admission medications   Medication Sig Start Date End Date Taking? Authorizing Provider  citalopram (CELEXA) 20 MG tablet Take 1 tablet (20 mg total) by mouth daily. 11/14/19   Elby Beck, FNP  indomethacin (INDOCIN) 50 MG capsule Take 1 capsule (50 mg total) by mouth 3 (three) times daily as needed. With meals 06/03/20   Tower, Wynelle Fanny, MD  lisinopril-hydrochlorothiazide (ZESTORETIC) 10-12.5 MG tablet Take 1 tablet by mouth daily. 11/14/19   Elby Beck, FNP  Multiple Vitamins-Minerals (MULTIVITAMIN ADULT) TABS Take 1 tablet by mouth daily.    [provider]  omeprazole (PRILOSEC) 40 MG capsule Take 40 mg by mouth daily.    [provider]  simvastatin (ZOCOR) 20 MG tablet Take 20 mg by mouth at bedtime.   03/19/20  [provider]    Allergies Patient has no known allergies.  Family History  Problem Relation Age of Onset  . Cancer Mother   . Heart failure Father     Social History Social History   Tobacco Use  . Smoking status: Never Smoker  . Smokeless tobacco: Current User    Types: Snuff  Vaping Use  . Vaping Use: Never used  Substance Use Topics  . Alcohol use: Yes    Comment: occasionally  . Drug use: No    Review of Systems  Constitutional: Negative for fever. Eyes: Negative for visual changes. ENT: Negative for sore throat. Cardiovascular: Negative for chest pain. Respiratory: Negative for shortness of breath.  Gastrointestinal: Positive for epigastric abdominal pain. Denies nasuea, vomiting and constipation. Genitourinary: Negative for dysuria. Musculoskeletal: Negative for back pain. Skin: Negative for rash. Neurological: Negative for headaches, focal weakness or numbness. ____________________________________________  PHYSICAL EXAM:  VITAL SIGNS: ED Triage Vitals  Enc Vitals Group     BP 08/09/20 1216 129/85      Pulse Rate 08/09/20 1216 75     Resp 08/09/20 1216 18     Temp 08/09/20 1216 98.7 F (37.1 C)     Temp Source 08/09/20 1216 Oral     SpO2 08/09/20 1216 98 %     Weight 08/09/20 1207 195 lb (88.5 kg)     Height 08/09/20 1207 5\' 11"  (1.803 m)     Head Circumference --      Peak Flow --      Pain Score 08/09/20 1206 3     Pain Loc --      Pain Edu? --      Excl. in [place]? --     Constitutional: Alert and oriented. Well appearing and in no distress. Head: Normocephalic and atraumatic. Eyes: Conjunctivae are normal. Normal extraocular movements Cardiovascular: Normal rate, regular rhythm. Normal distal pulses. Respiratory: Normal respiratory effort. No wheezes/rales/rhonchi. Gastrointestinal: Soft and only mildly tender to palpation to the epigastrium. No distention or rebound, guarding, or rigidity.  Normoactive bowel sounds appreciated.  No CVA tenderness noted. Musculoskeletal: Nontender with normal range of motion in all extremities.  Neurologic:  Normal gait without ataxia. Normal speech and language. No gross focal neurologic deficits are appreciated. Skin:  Skin is warm, dry and intact. No rash noted. Psychiatric: Mood and affect are normal. Patient exhibits appropriate insight and judgment. ____________________________________________   LABS (pertinent positives/negatives)  Labs Reviewed  COMPREHENSIVE METABOLIC PANEL - Abnormal; Notable for the following components:      Result Value   Glucose, Bld 116 (*)    Total Protein 8.2 (*)    Total Bilirubin 1.6 (*)    All other components within normal limits  CBC - Abnormal; Notable for the following components:   Platelets 415 (*)    All other components within normal limits  LIPASE, BLOOD  TROPONIN I (HIGH SENSITIVITY)  ____________________________________________  EKG  Vent. rate 79 BPM PR interval 165 ms QRS duration 99 ms QT/QTcB 381/437 ms P-R-T axes 38 -64  49 ____________________________________________  PROCEDURES  GI cocktail PO Procedures ____________________________________________  INITIAL IMPRESSION / ASSESSMENT AND PLAN / ED COURSE  Differential diagnosis includes, but is not limited to, ACS, aortic dissection, pulmonary embolism, cardiac tamponade, pneumothorax, pneumonia, pericarditis, myocarditis, GI-related causes including esophagitis/gastritis, and musculoskeletal chest wall pain.    Patient ED evaluation of a 18-hour onset of epigastric abdominal pain.  Patient describes a dull constant pressure that was concerning for him for cardiac etiology.  He does have a history of GERD, and has had similar symptoms in the past.  Patient's clinical work-up is reassuring as it shows no elevation of troponin and labs otherwise stable.  EKG without any signs of acute coronary syndrome.  Is also reporting significant decrease in his pain at the time of this evaluation and disposition.  He treated in the ED with a GI cocktail, and is likely stable for discharge.  He will follow with primary provider for ongoing symptoms peer return to the ED if needed.   Patrick Harshfield. was evaluated in Emergency Department on 08/09/2020 for the symptoms described in the history of present illness. He was evaluated in the  context of the global COVID-19 pandemic, which necessitated consideration that the patient might be at risk for infection with the SARS-CoV-2 virus that causes COVID-19. Institutional protocols and algorithms that pertain to the evaluation of patients at risk for COVID-19 are in a state of rapid change based on information released by regulatory bodies including the CDC and federal and state organizations. These policies and algorithms were followed during the patient's care in the ED. ____________________________________________  FINAL CLINICAL IMPRESSION(S) / ED DIAGNOSES  Final diagnoses:  Epigastric pain      Patrick Barajas, Dannielle Karvonen,  PA-C 08/09/20 2125    Naaman Plummer, MD 08/10/20 2332

## 2020-08-09 NOTE — Discharge Instructions (Signed)
Your exam, and labs are normal at this time.  No sign of a cardiac cause for your abdominal pain.  Your symptoms likely represent a flare of your GERD, secondary to taking anti-inflammatories on an empty stomach.  Follow-up with your primary provider or return to the ED if needed.

## 2020-08-21 ENCOUNTER — Encounter: Payer: BC Managed Care – PPO | Admitting: Family Medicine

## 2020-08-21 DIAGNOSIS — Z0289 Encounter for other administrative examinations: Secondary | ICD-10-CM

## 2020-10-07 ENCOUNTER — Telehealth: Payer: Self-pay

## 2020-10-07 ENCOUNTER — Other Ambulatory Visit: Payer: Self-pay | Admitting: Family

## 2020-10-07 DIAGNOSIS — F419 Anxiety disorder, unspecified: Secondary | ICD-10-CM

## 2020-10-07 DIAGNOSIS — I10 Essential (primary) hypertension: Secondary | ICD-10-CM

## 2020-10-07 MED ORDER — LISINOPRIL-HYDROCHLOROTHIAZIDE 10-12.5 MG PO TABS: 1.0000 | ORAL_TABLET | Freq: Every day | ORAL | 3 refills | Status: DC

## 2020-10-07 MED ORDER — CITALOPRAM HYDROBROMIDE 20 MG PO TABS: 20.0000 mg | ORAL_TABLET | Freq: Every day | ORAL | 3 refills | Status: DC

## 2020-10-07 NOTE — Telephone Encounter (Signed)
Received fax from [facility] requesting refill of Celexa and lisinopril/HCTZ 10-12.5  Last apt:  06/03/20 Next apt: none scheduled at this time Last fill: both on 11/13/20 for #90, 3RF

## 2020-11-04 IMAGING — US ULTRASOUND ABDOMEN LIMITED
1 series · 14 of 25 positions shown · non-contrast
Comparison: CT abdomen and pelvis 06/20/2018

CLINICAL DATA: Intermittent abdominal pain, elevated lipase,
history hypertension, GERD

EXAM:
ULTRASOUND ABDOMEN LIMITED RIGHT UPPER QUADRANT

[Series 1: ultrasound abdomen limited · 14 of 46 slices shown]
[im 1/46]
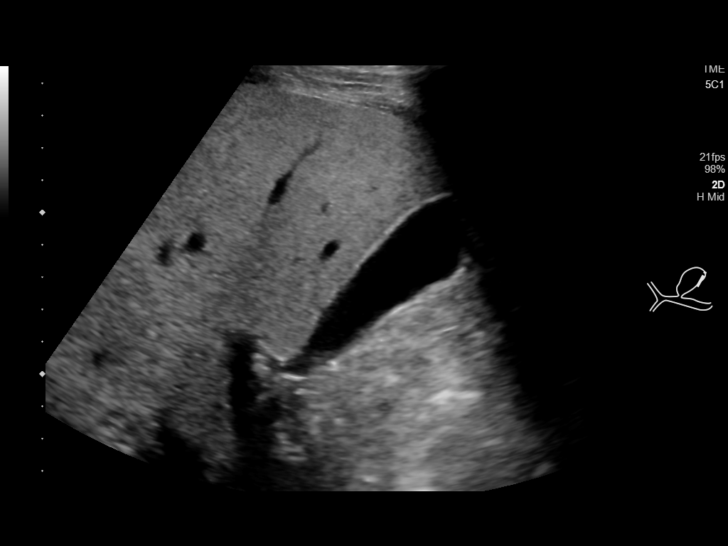
[im 4/46]
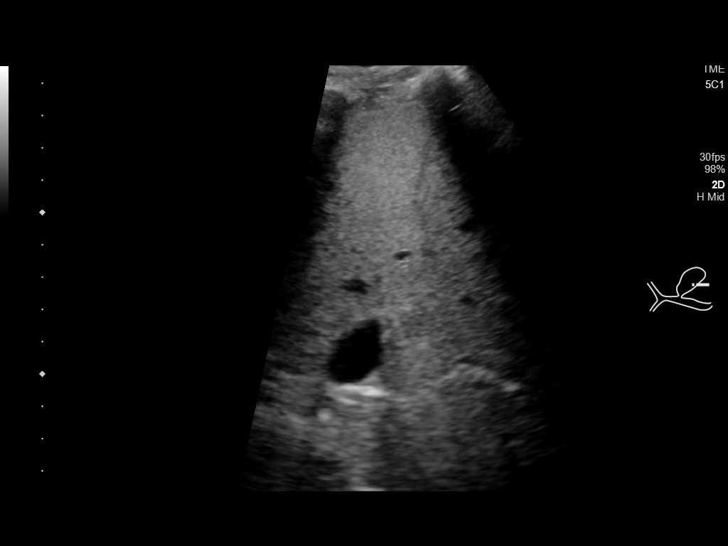
[im 8/46]
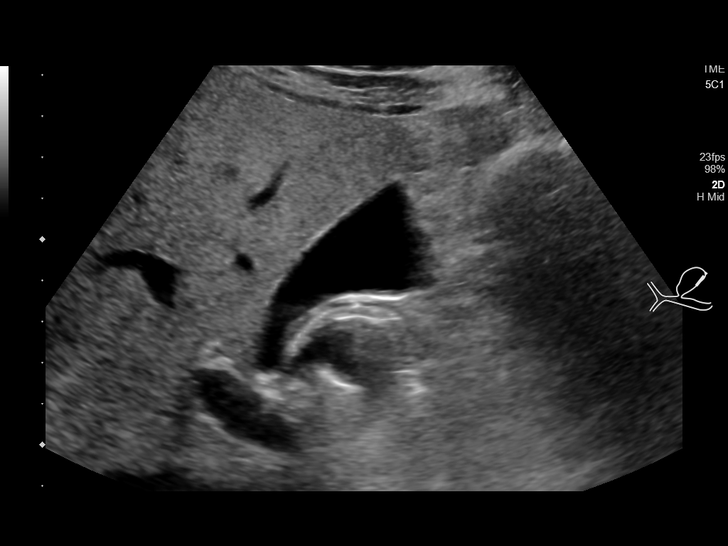
[im 12/46]
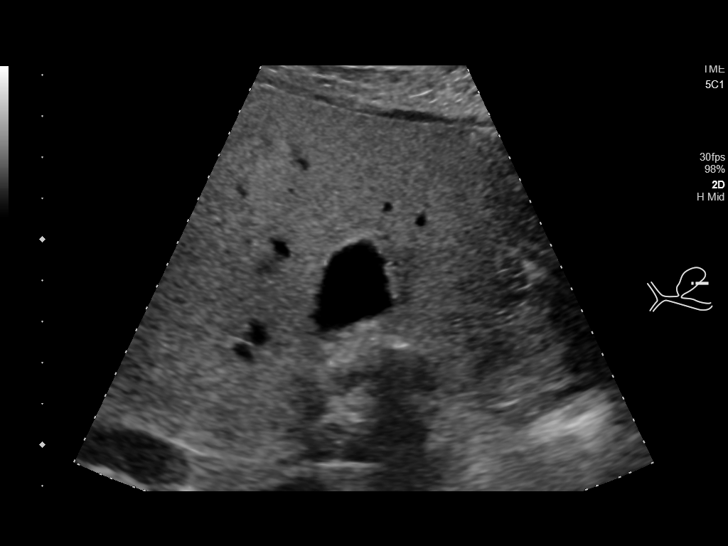
[im 16/46]
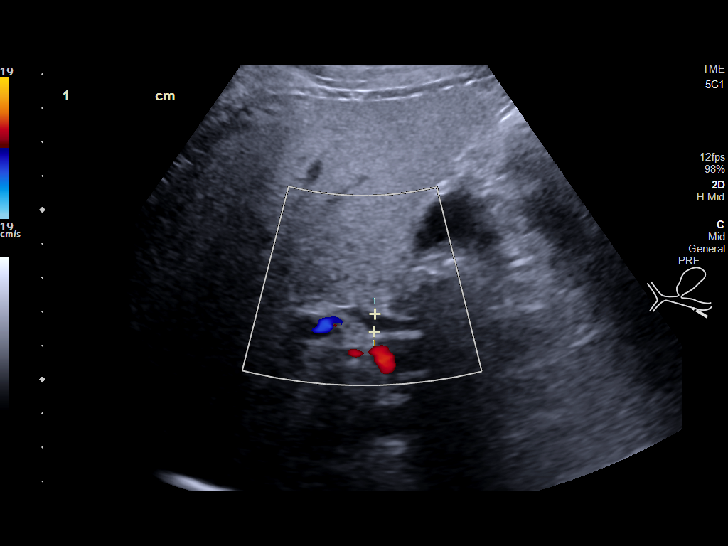
[im 17/46]
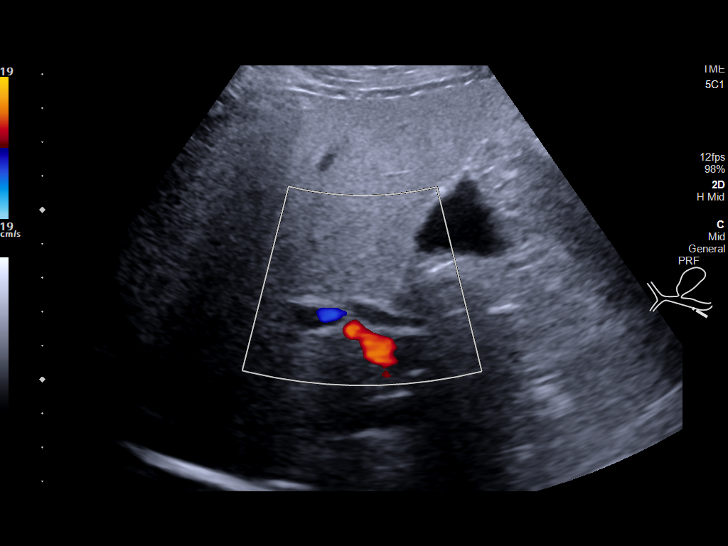
[im 21/46]
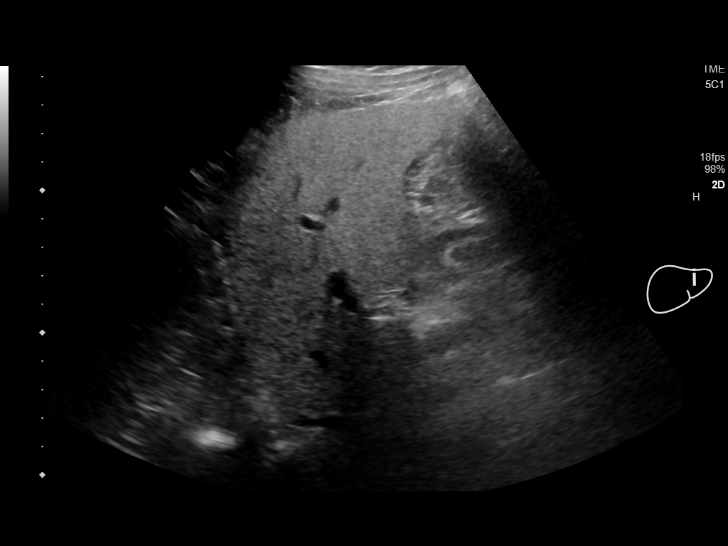
[im 25/46]
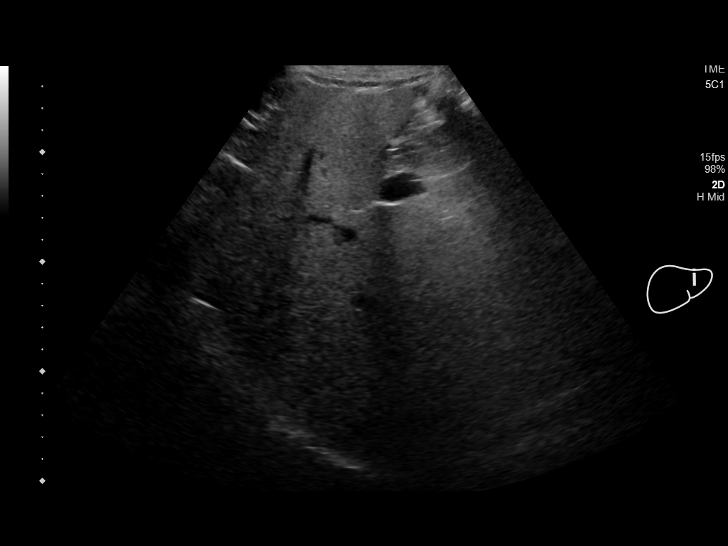
[im 29/46]
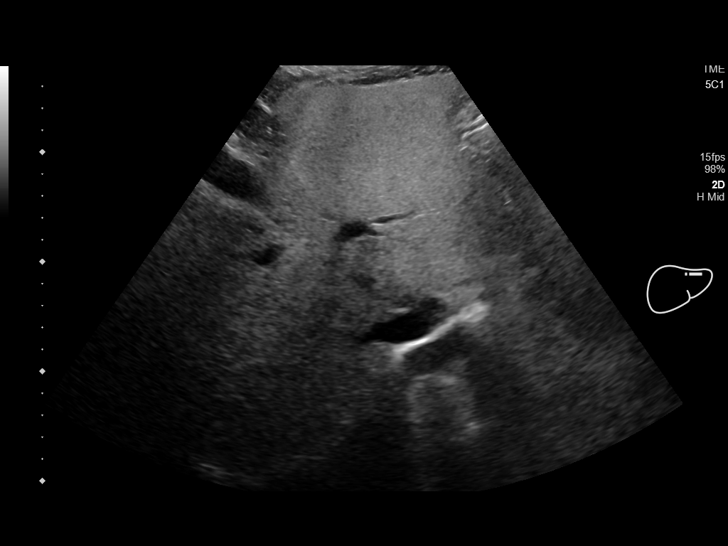
[im 31/46]
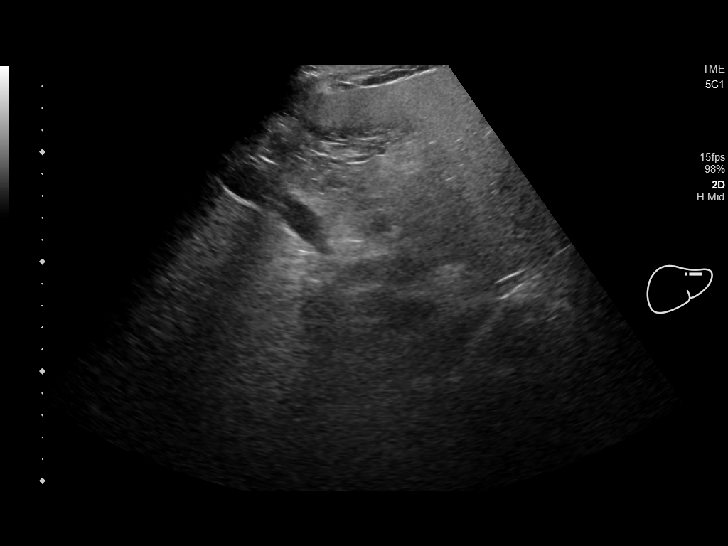
[im 34/46]
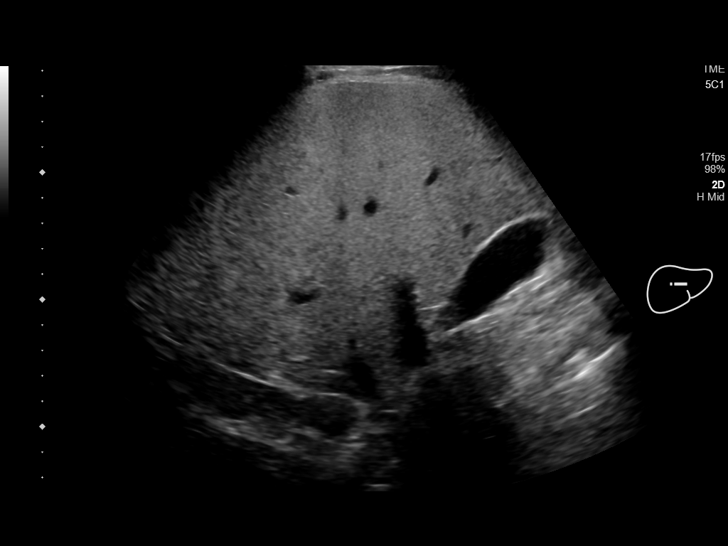
[im 38/46]
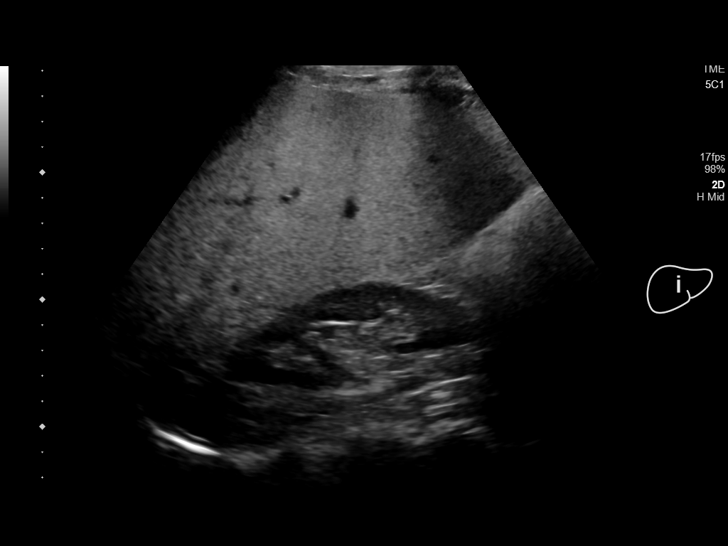
[im 42/46]
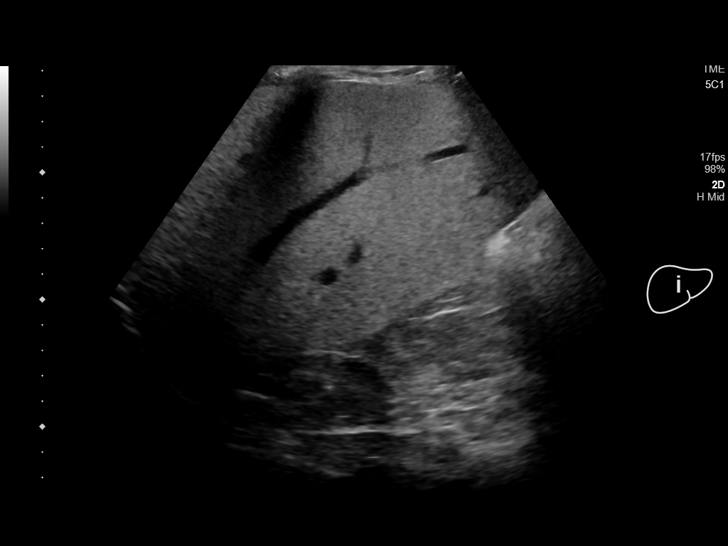
[im 46/46]
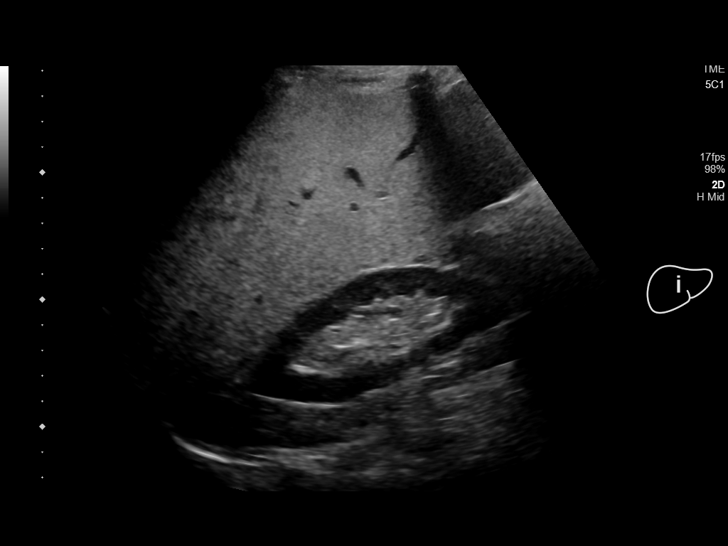

[14 of 25 positions shown; findings below may reference images not displayed]

FINDINGS: Gallbladder:

Normally distended without stones or wall thickening. No
pericholecystic fluid or sonographic Murphy sign.

Common bile duct:

Diameter: 5 mm diameter, normal

Liver:

Echogenic liver with smooth hepatic margins consistent with fatty
infiltration, also noted on prior CT. No hepatic mass or nodularity.
Portal vein is patent on color Doppler imaging with normal direction
of blood flow towards the liver.

No RIGHT upper quadrant free fluid.
IMPRESSION: Fatty infiltration of liver.

Otherwise normal exam.

## 2020-11-04 IMAGING — DX PORTABLE CHEST - 1 VIEW
1 series · 1 of 1 positions shown · non-contrast
Comparison: None.

CLINICAL DATA: Abdominal pain.

EXAM:
PORTABLE CHEST 1 VIEW

[chest ap]
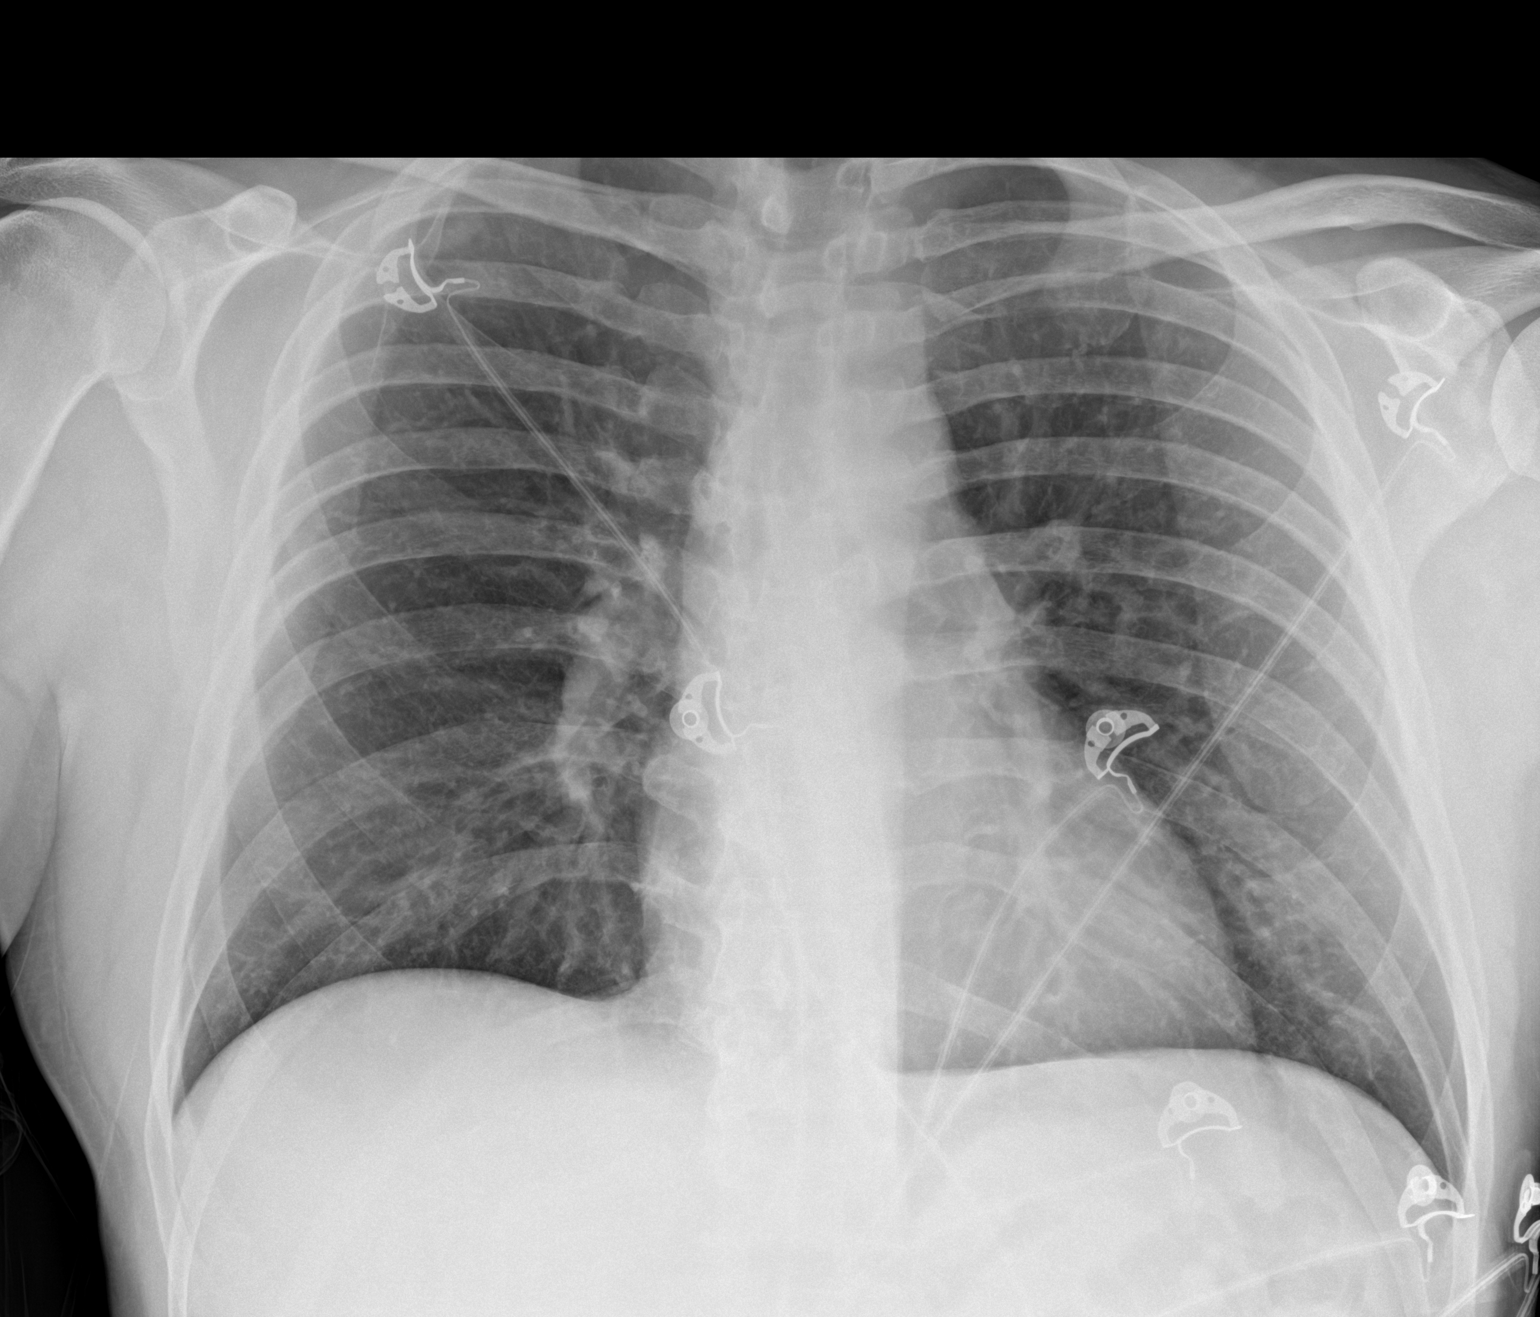

[1 of 1 positions shown; findings below may reference images not displayed]

FINDINGS: The heart size and mediastinal contours are within normal limits.
Both lungs are clear. The visualized skeletal structures are
unremarkable.
IMPRESSION: No active disease.

## 2020-11-17 ENCOUNTER — Other Ambulatory Visit: Payer: Self-pay | Admitting: Family

## 2020-11-17 ENCOUNTER — Other Ambulatory Visit: Payer: Self-pay

## 2020-11-17 DIAGNOSIS — F419 Anxiety disorder, unspecified: Secondary | ICD-10-CM

## 2020-11-17 MED ORDER — CITALOPRAM HYDROBROMIDE 20 MG PO TABS: 20.0000 mg | ORAL_TABLET | Freq: Every day | ORAL | 3 refills | Status: DC

## 2020-11-17 NOTE — Progress Notes (Signed)
LAST OV -06/03/2020 NEXT OV - N/A LAST FILLED -10/07/2020

## 2021-05-16 ENCOUNTER — Ambulatory Visit
Admission: EM | Admit: 2021-05-16 | Discharge: 2021-05-16 | Disposition: A | Discharge status: Home / Self Care | Payer: BC Managed Care – PPO

## 2021-05-16 ENCOUNTER — Other Ambulatory Visit: Payer: Self-pay

## 2021-05-16 DIAGNOSIS — K219 Gastro-esophageal reflux disease without esophagitis: Secondary | ICD-10-CM | POA: Diagnosis not present

## 2021-05-16 NOTE — ED Triage Notes (Signed)
Pt states that he has a history of stomach issues. Pt called the ambulance yesterday due to upper abdominal pain and had an EKG done. Pt states that he was told to get blood test done to make sure that his heart Is not having any issues. Pt states that he woke up with a similar discomfort and worries about it being a bigger issue than acid reflux. Pt asks for blood work to be done. Pt states that it feels like his acid reflux but it is happening "higher than normal".  Pt does not have a PCP.

## 2021-05-16 NOTE — Discharge Instructions (Addendum)
Continue use of omeprazole daily  If symptoms recur attempted use of Maalox, if symptoms subside then you know this is not your heart, if symptoms persist past use of medicine please go to the nearest emergency department to rule out your heart  Information for a gastrointestinal specialist has been listed below so that you may reestablish care

## 2021-05-16 NOTE — ED Provider Notes (Signed)
[facility]    CSN: [id number] Arrival date & time: 05/16/21  0802      History   Chief Complaint Chief Complaint  Patient presents with   Abdominal Pain    HPI Patrick Barajas. is a 56 y.o. male.   Patient presents with epigastric abdominal pain occurring for 1 day.  Pain is intermittent and described as pressure.  Symptoms resolved and began again upon awakening.  Seen by EMS yesterday who completed a EKG which showed normal sinus rhythm with regular pace was instructed to follow-up for cardiac blood work.  Denies associated shortness of breath, dizziness, lightheadedness, chest pain or tightness, blurry vision, abdominal pain, URI symptoms, nausea, vomiting, diarrhea.  History of anxiety, GERD and hypertension.  Takes omeprazole daily.  Followed by GI but is in the process of reestablishing care as his doctor has retired.  Completed endoscopy and colonoscopy 4 to 5 years ago which were negative.   Past Medical History:  Diagnosis Date   Anxiety    GERD (gastroesophageal reflux disease)    Hypertension     Patient Active Problem List   Diagnosis Date Noted   Gout 06/03/2020   Tobacco use 09/01/2019   Anxiety 08/31/2019   GERD (gastroesophageal reflux disease) 08/31/2019   HTN (hypertension) 08/31/2019   Hypertriglyceridemia 08/31/2019    Past Surgical History:  Procedure Laterality Date   COLONOSCOPY WITH PROPOFOL N/A 10/20/2018   Procedure: COLONOSCOPY WITH PROPOFOL;  Surgeon: Lollie Sails, MD;  Location: [facility] ENDOSCOPY;  Service: Endoscopy;  Laterality: N/A;   ESOPHAGOGASTRODUODENOSCOPY (EGD) WITH PROPOFOL N/A 10/20/2018   Procedure: ESOPHAGOGASTRODUODENOSCOPY (EGD) WITH PROPOFOL;  Surgeon: Lollie Sails, MD;  Location: [facility] ENDOSCOPY;  Service: Endoscopy;  Laterality: N/A;       Home Medications    Prior to Admission medications   Medication Sig Start Date End Date Taking? Authorizing Provider  citalopram (CELEXA) 20 MG tablet Take 1  tablet (20 mg total) by mouth daily. 11/17/20  Yes Dutch Quint B, FNP  indomethacin (INDOCIN) 50 MG capsule Take 1 capsule (50 mg total) by mouth 3 (three) times daily as needed. With meals 06/03/20  Yes Tower, Wynelle Fanny, MD  lisinopril-hydrochlorothiazide (ZESTORETIC) 10-12.5 MG tablet Take 1 tablet by mouth daily. 10/07/20  Yes Dutch Quint B, FNP  Multiple Vitamins-Minerals (MULTIVITAMIN ADULT) TABS Take 1 tablet by mouth daily.   Yes [provider]  omeprazole (PRILOSEC) 40 MG capsule Take 40 mg by mouth daily.   Yes [provider]  simvastatin (ZOCOR) 20 MG tablet Take 20 mg by mouth at bedtime.   03/19/20  [provider]    Family History Family History  Problem Relation Age of Onset   Cancer Mother    Heart failure Father     Social History Social History   Tobacco Use   Smoking status: Never   Smokeless tobacco: Current    Types: Snuff  Vaping Use   Vaping Use: Never used  Substance Use Topics   Alcohol use: Yes    Comment: occasionally   Drug use: No     Allergies   Patient has no known allergies.   Review of Systems Review of Systems  Constitutional: Negative.   Respiratory: Negative.    Cardiovascular: Negative.   Gastrointestinal:  Positive for abdominal pain. Negative for abdominal distention, anal bleeding, blood in stool, constipation, diarrhea, nausea, rectal pain and vomiting.  Skin: Negative.   Neurological: Negative.     Physical Exam Triage Vital Signs  ED Triage Vitals  Enc Vitals Group     BP 05/16/21 0812 (!) 134/100     Pulse Rate 05/16/21 0812 78     Resp 05/16/21 0812 18     Temp 05/16/21 0812 99.2 F (37.3 C)     Temp Source 05/16/21 0812 Oral     SpO2 05/16/21 0812 99 %     Weight 05/16/21 0811 200 lb (90.7 kg)     Height 05/16/21 0811 5\' 11"  (1.803 m)     Head Circumference --      Peak Flow --      Pain Score 05/16/21 0810 3     Pain Loc --      Pain Edu? --      Excl. in [place]? --    No data  found.  Updated Vital Signs BP (!) 134/100 (BP Location: Left Arm)    Pulse 78    Temp 99.2 F (37.3 C) (Oral)    Resp 18    Ht 5\' 11"  (1.803 m)    Wt 200 lb (90.7 kg)    SpO2 99%    BMI 27.89 kg/m   Visual Acuity Right Eye Distance:   Left Eye Distance:   Bilateral Distance:    Right Eye Near:   Left Eye Near:    Bilateral Near:     Physical Exam Constitutional:      Appearance: He is well-developed.  HENT:     Head: Normocephalic.  Eyes:     Extraocular Movements: Extraocular movements intact.  Cardiovascular:     Rate and Rhythm: Normal rate and regular rhythm.     Pulses: Normal pulses.     Heart sounds: Normal heart sounds.  Pulmonary:     Effort: Pulmonary effort is normal.     Breath sounds: Normal breath sounds.  Abdominal:     General: Abdomen is flat. Bowel sounds are normal.     Palpations: Abdomen is soft.     Tenderness: There is abdominal tenderness in the epigastric area.  Skin:    General: Skin is warm and dry.  Neurological:     Mental Status: He is alert and oriented to person, place, and time. Mental status is at baseline.  Psychiatric:        Mood and Affect: Mood normal.        Behavior: Behavior normal.     UC Treatments / Results  Labs (all labs ordered are listed, but only abnormal results are displayed) Labs Reviewed - No data to display  EKG   Radiology No results found.  Procedures Procedures (including critical care time)  Medications Ordered in UC Medications - No data to display  Initial Impression / Assessment and Plan / UC Course  I have reviewed the triage vital signs and the nursing notes.  Pertinent labs & imaging results that were available during my care of the patient were reviewed by me and considered in my medical decision making (see chart for details).  GERD without esophagitis  Vital signs are stable, patient is in no signs of distress, EKG completed yesterday evening showing normal sinus rhythm, will not  repeat today, discussed with patient that in urgent care setting we do not complete cardiac blood work, verbalized understanding, patient has had similar symptoms in the past and does not have cardiac history, recommended if symptoms reoccur to attempt use of Maalox and if symptoms continue to persist  past use of medicine to follow-up at nearest emergency department for  further evaluation, advised patient to continue omeprazole daily and given walking referral to GI for further evaluation Final Clinical Impressions(s) / UC Diagnoses   Final diagnoses:  Gastroesophageal reflux disease without esophagitis     Discharge Instructions      Continue use of omeprazole daily  If symptoms recur attempted use of Maalox, if symptoms subside then you know this is not your heart, if symptoms persist past use of medicine please go to the nearest emergency department to rule out your heart  Information for a gastrointestinal specialist has been listed below so that you may reestablish care   ED Prescriptions   None    PDMP not reviewed this encounter.   Hans [place], [place] 05/16/21 [phone]

## 2021-07-22 ENCOUNTER — Ambulatory Visit: Payer: BC Managed Care – PPO | Admitting: Adult Health

## 2021-09-10 ENCOUNTER — Ambulatory Visit: Payer: BC Managed Care – PPO | Admitting: Family

## 2021-10-01 ENCOUNTER — Other Ambulatory Visit: Payer: Self-pay

## 2021-10-01 ENCOUNTER — Emergency Department: Payer: BC Managed Care – PPO

## 2021-10-01 ENCOUNTER — Inpatient Hospital Stay
Admission: EM | Admit: 2021-10-01 | Discharge: 2021-10-03 | DRG: 897 | Disposition: A | Discharge status: Home / Self Care | Payer: BC Managed Care – PPO | Attending: Internal Medicine | Admitting: Internal Medicine

## 2021-10-01 DIAGNOSIS — K2920 Alcoholic gastritis without bleeding: Secondary | ICD-10-CM | POA: Diagnosis not present

## 2021-10-01 DIAGNOSIS — F17290 Nicotine dependence, other tobacco product, uncomplicated: Secondary | ICD-10-CM | POA: Diagnosis present

## 2021-10-01 DIAGNOSIS — F419 Anxiety disorder, unspecified: Secondary | ICD-10-CM | POA: Diagnosis present

## 2021-10-01 DIAGNOSIS — F10939 Alcohol use, unspecified with withdrawal, unspecified: Secondary | ICD-10-CM | POA: Diagnosis present

## 2021-10-01 DIAGNOSIS — E871 Hypo-osmolality and hyponatremia: Secondary | ICD-10-CM | POA: Diagnosis present

## 2021-10-01 DIAGNOSIS — F10930 Alcohol use, unspecified with withdrawal, uncomplicated: Secondary | ICD-10-CM

## 2021-10-01 DIAGNOSIS — T502X5A Adverse effect of carbonic-anhydrase inhibitors, benzothiadiazides and other diuretics, initial encounter: Secondary | ICD-10-CM | POA: Diagnosis present

## 2021-10-01 DIAGNOSIS — Z8249 Family history of ischemic heart disease and other diseases of the circulatory system: Secondary | ICD-10-CM | POA: Diagnosis not present

## 2021-10-01 DIAGNOSIS — F10130 Alcohol abuse with withdrawal, uncomplicated: Principal | ICD-10-CM | POA: Diagnosis present

## 2021-10-01 DIAGNOSIS — R109 Unspecified abdominal pain: Secondary | ICD-10-CM | POA: Diagnosis present

## 2021-10-01 DIAGNOSIS — I10 Essential (primary) hypertension: Secondary | ICD-10-CM | POA: Diagnosis present

## 2021-10-01 DIAGNOSIS — F10239 Alcohol dependence with withdrawal, unspecified: Secondary | ICD-10-CM | POA: Diagnosis not present

## 2021-10-01 DIAGNOSIS — Z809 Family history of malignant neoplasm, unspecified: Secondary | ICD-10-CM

## 2021-10-01 DIAGNOSIS — K760 Fatty (change of) liver, not elsewhere classified: Secondary | ICD-10-CM | POA: Diagnosis not present

## 2021-10-01 DIAGNOSIS — K219 Gastro-esophageal reflux disease without esophagitis: Secondary | ICD-10-CM | POA: Diagnosis present

## 2021-10-01 DIAGNOSIS — I700 Atherosclerosis of aorta: Secondary | ICD-10-CM | POA: Diagnosis not present

## 2021-10-01 DIAGNOSIS — K7689 Other specified diseases of liver: Secondary | ICD-10-CM

## 2021-10-01 DIAGNOSIS — R945 Abnormal results of liver function studies: Secondary | ICD-10-CM | POA: Diagnosis not present

## 2021-10-01 LAB — CBC
HCT: 42.8 % (ref 39.0–52.0)
Hemoglobin: 15.4 g/dL (ref 13.0–17.0)
MCH: 34.3 pg — ABNORMAL HIGH (ref 26.0–34.0)
MCHC: 36 g/dL (ref 30.0–36.0)
MCV: 95.3 fL (ref 80.0–100.0)
Platelets: 301 10*3/uL (ref 150–400)
RBC: 4.49 MIL/uL (ref 4.22–5.81)
RDW: 11.3 % — ABNORMAL LOW (ref 11.5–15.5)
WBC: 5.7 10*3/uL (ref 4.0–10.5)
nRBC: 0 % (ref 0.0–0.2)

## 2021-10-01 LAB — COMPREHENSIVE METABOLIC PANEL
ALT: 158 U/L — ABNORMAL HIGH (ref 0–44)
AST: 189 U/L — ABNORMAL HIGH (ref 15–41)
Albumin: 4.8 g/dL (ref 3.5–5.0)
Alkaline Phosphatase: 56 U/L (ref 38–126)
Anion gap: 15 (ref 5–15)
BUN: 7 mg/dL (ref 6–20)
CO2: 22 mmol/L (ref 22–32)
Calcium: 9.4 mg/dL (ref 8.9–10.3)
Chloride: 92 mmol/L — ABNORMAL LOW (ref 98–111)
Creatinine, Ser: 0.65 mg/dL (ref 0.61–1.24)
GFR, Estimated: >60 mL/min (ref >60)
Glucose, Bld: 98 mg/dL (ref 70–99)
Potassium: 4 mmol/L (ref 3.5–5.1)
Sodium: 129 mmol/L — ABNORMAL LOW (ref 135–145)
Total Bilirubin: 3.6 mg/dL — ABNORMAL HIGH (ref 0.3–1.2)
Total Protein: 8.5 g/dL — ABNORMAL HIGH (ref 6.5–8.1)

## 2021-10-01 LAB — SODIUM, URINE, RANDOM: Sodium, Ur: 95 mmol/L

## 2021-10-01 LAB — HIV ANTIBODY (ROUTINE TESTING W REFLEX): HIV Screen 4th Generation wRfx: NONREACTIVE

## 2021-10-01 LAB — URINALYSIS, ROUTINE W REFLEX MICROSCOPIC
Bacteria, UA: NONE SEEN
Bilirubin Urine: NEGATIVE
Glucose, UA: NEGATIVE mg/dL
Ketones, ur: 20 mg/dL — AB
Leukocytes,Ua: NEGATIVE
Nitrite: NEGATIVE
Protein, ur: 30 mg/dL — AB
Specific Gravity, Urine: 1.012 (ref 1.005–1.030)
Squamous Epithelial / HPF: NONE SEEN (ref 0–5)
pH: 6 (ref 5.0–8.0)

## 2021-10-01 LAB — OSMOLALITY, URINE: Osmolality, Ur: 486 mOsm/kg (ref 300–900)

## 2021-10-01 LAB — PROTIME-INR
INR: 1 (ref 0.8–1.2)
Prothrombin Time: 12.9 seconds (ref 11.4–15.2)

## 2021-10-01 LAB — OSMOLALITY: Osmolality: 280 mOsm/kg (ref 275–295)

## 2021-10-01 LAB — LIPASE, BLOOD: Lipase: 67 U/L — ABNORMAL HIGH (ref 11–51)

## 2021-10-01 MED ORDER — THIAMINE HCL 100 MG PO TABS
100.0000 mg | ORAL_TABLET | Freq: Every day | ORAL | Status: DC
  Administered 2021-10-01 – 2021-10-03 (×3): 100 mg via ORAL
  Filled 2021-10-01 (×3): qty 1

## 2021-10-01 MED ORDER — CLONIDINE HCL 0.1 MG PO TABS
0.1000 mg | ORAL_TABLET | Freq: Two times a day (BID) | ORAL | Status: DC
  Administered 2021-10-01 – 2021-10-03 (×5): 0.1 mg via ORAL
  Filled 2021-10-01 (×5): qty 1

## 2021-10-01 MED ORDER — ONDANSETRON HCL 4 MG/2ML IJ SOLN: 4.0000 mg | Freq: Four times a day (QID) | INTRAMUSCULAR | Status: DC | PRN

## 2021-10-01 MED ORDER — SODIUM CHLORIDE 0.9 % IV BOLUS
1000.0000 mL | Freq: Once | INTRAVENOUS | Status: AC
  Administered 2021-10-01: 1000 mL via INTRAVENOUS

## 2021-10-01 MED ORDER — THIAMINE HCL 100 MG/ML IJ SOLN
100.0000 mg | Freq: Every day | INTRAMUSCULAR | Status: DC
  Filled 2021-10-01: qty 2

## 2021-10-01 MED ORDER — ENOXAPARIN SODIUM 40 MG/0.4ML IJ SOSY
40.0000 mg | PREFILLED_SYRINGE | INTRAMUSCULAR | Status: DC
  Administered 2021-10-01 – 2021-10-02 (×2): 40 mg via SUBCUTANEOUS
  Filled 2021-10-01 (×2): qty 0.4

## 2021-10-01 MED ORDER — SODIUM CHLORIDE 0.9 % IV SOLN: INTRAVENOUS | Status: AC

## 2021-10-01 MED ORDER — LORAZEPAM 1 MG PO TABS: 0.0000 mg | ORAL_TABLET | Freq: Two times a day (BID) | ORAL | Status: DC

## 2021-10-01 MED ORDER — LORAZEPAM 1 MG PO TABS
1.0000 mg | ORAL_TABLET | ORAL | Status: DC | PRN
  Filled 2021-10-01 (×3): qty 1

## 2021-10-01 MED ORDER — FOLIC ACID 1 MG PO TABS
1.0000 mg | ORAL_TABLET | Freq: Every day | ORAL | Status: DC
  Administered 2021-10-01 – 2021-10-03 (×3): 1 mg via ORAL
  Filled 2021-10-01 (×3): qty 1

## 2021-10-01 MED ORDER — LORAZEPAM 2 MG/ML IJ SOLN: 1.0000 mg | INTRAMUSCULAR | Status: DC | PRN

## 2021-10-01 MED ORDER — SODIUM CHLORIDE 0.9 % IV SOLN: 250.0000 mL | INTRAVENOUS | Status: DC | PRN

## 2021-10-01 MED ORDER — LORAZEPAM 1 MG PO TABS: 0.0000 mg | ORAL_TABLET | Freq: Four times a day (QID) | ORAL | Status: DC

## 2021-10-01 MED ORDER — ONDANSETRON HCL 4 MG PO TABS: 4.0000 mg | ORAL_TABLET | Freq: Four times a day (QID) | ORAL | Status: DC | PRN

## 2021-10-01 MED ORDER — PANTOPRAZOLE SODIUM 40 MG PO TBEC
40.0000 mg | DELAYED_RELEASE_TABLET | Freq: Every day | ORAL | Status: DC
  Administered 2021-10-01 – 2021-10-03 (×3): 40 mg via ORAL
  Filled 2021-10-01 (×3): qty 1

## 2021-10-01 MED ORDER — MELATONIN 5 MG PO TABS
5.0000 mg | ORAL_TABLET | Freq: Every evening | ORAL | Status: DC | PRN
  Administered 2021-10-01 – 2021-10-02 (×2): 5 mg via ORAL
  Filled 2021-10-01 (×3): qty 1

## 2021-10-01 MED ORDER — ADULT MULTIVITAMIN W/MINERALS CH
1.0000 | ORAL_TABLET | Freq: Every day | ORAL | Status: DC
  Administered 2021-10-01 – 2021-10-03 (×3): 1 via ORAL
  Filled 2021-10-01 (×3): qty 1

## 2021-10-01 MED ORDER — PANTOPRAZOLE SODIUM 40 MG IV SOLR: 40.0000 mg | INTRAVENOUS | Status: DC

## 2021-10-01 MED ORDER — LORAZEPAM 1 MG PO TABS
1.0000 mg | ORAL_TABLET | ORAL | Status: DC | PRN
  Administered 2021-10-01 – 2021-10-02 (×6): 1 mg via ORAL
  Filled 2021-10-01 (×3): qty 1

## 2021-10-01 MED ORDER — SODIUM CHLORIDE 0.9% FLUSH: 3.0000 mL | INTRAVENOUS | Status: DC | PRN

## 2021-10-01 MED ORDER — KETOROLAC TROMETHAMINE 30 MG/ML IJ SOLN
15.0000 mg | Freq: Once | INTRAMUSCULAR | Status: AC
  Administered 2021-10-01: 15 mg via INTRAVENOUS
  Filled 2021-10-01: qty 1

## 2021-10-01 MED ORDER — SODIUM CHLORIDE 0.9% FLUSH
3.0000 mL | Freq: Two times a day (BID) | INTRAVENOUS | Status: DC
  Administered 2021-10-01: 3 mL via INTRAVENOUS

## 2021-10-01 MED ORDER — LORAZEPAM 2 MG/ML IJ SOLN
1.0000 mg | INTRAMUSCULAR | Status: DC | PRN
  Administered 2021-10-01: 2 mg via INTRAVENOUS
  Filled 2021-10-01: qty 1

## 2021-10-01 MED ORDER — CITALOPRAM HYDROBROMIDE 20 MG PO TABS
20.0000 mg | ORAL_TABLET | Freq: Every day | ORAL | Status: DC
  Administered 2021-10-01 – 2021-10-03 (×3): 20 mg via ORAL
  Filled 2021-10-01 (×3): qty 1

## 2021-10-01 MED ORDER — IOHEXOL 300 MG/ML  SOLN
100.0000 mL | Freq: Once | INTRAMUSCULAR | Status: AC | PRN
  Administered 2021-10-01: 100 mL via INTRAVENOUS

## 2021-10-01 NOTE — ED Triage Notes (Signed)
Pt c/o sharp stabbing LUQ pain since earlier this morning, denies N/V/D.Marland Kitchen pt is in NAD on arrival

## 2021-10-01 NOTE — Assessment & Plan Note (Addendum)
Patient with a history of anxiety disorder worsened now by alcohol withdrawal Continue lorazepam per alcohol withdrawal protocol Continue Celexa.

## 2021-10-01 NOTE — Assessment & Plan Note (Addendum)
Hold lisinopril/hydrochlorothiazide for now Continue low-dose clonidine which will also help with his withdrawal symptoms

## 2021-10-01 NOTE — Assessment & Plan Note (Addendum)
Switch to oral Protonix from IV

## 2021-10-01 NOTE — Assessment & Plan Note (Addendum)
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

## 2021-10-01 NOTE — Assessment & Plan Note (Addendum)
Likely due to HCTZ.  Sodium improved with stopping HCTZ.  134 today

## 2021-10-01 NOTE — ED Provider Notes (Signed)
[facility] Provider Note    Event Date/Time   First MD Initiated Contact with Patient 10/01/21 [phone]     (approximate)   History   Abdominal Pain   HPI  Patrick Anglemyer. is a 56 y.o. male presents to the emergency department for treatment and evaluation of left upper quadrant abdominal pain that is sharp in nature.  He denies nausea, vomiting, diarrhea or fever.  No alleviating measures attempted prior to arrival.  Past Medical History:  Diagnosis Date   Anxiety    GERD (gastroesophageal reflux disease)    Hypertension      Physical Exam   Triage Vital Signs: ED Triage Vitals  Enc Vitals Group     BP 10/01/21 0727 (!) 147/94     Pulse Rate 10/01/21 0727 96     Resp 10/01/21 0727 16     Temp 10/01/21 0727 98.3 F (36.8 C)     Temp Source 10/01/21 0727 Oral     SpO2 10/01/21 0727 96 %     Weight 10/01/21 0728 195 lb (88.5 kg)     Height 10/01/21 0728 '5\' 11"'$  (1.803 m)     Head Circumference --      Peak Flow --      Pain Score 10/01/21 0722 9     Pain Loc --      Pain Edu? --      Excl. in [place]? --     Most recent vital signs: Vitals:   10/01/21 1132 10/01/21 1306  BP: 138/84 (!) 165/93  Pulse: 84 93  Resp: 18   Temp:    SpO2: 98%     General: Awake, no distress.  CV:  Good peripheral perfusion.  Resp:  Normal effort.  Abd:  No distention.  Focal tenderness in the left upper and mid abdomen without guarding or rebound. Other:     ED Results / Procedures / Treatments   Labs (all labs ordered are listed, but only abnormal results are displayed) Labs Reviewed  LIPASE, BLOOD - Abnormal; Notable for the following components:      Result Value   Lipase 67 (*)    All other components within normal limits  COMPREHENSIVE METABOLIC PANEL - Abnormal; Notable for the following components:   Sodium 129 (*)    Chloride 92 (*)    Total Protein 8.5 (*)    AST 189 (*)    ALT 158 (*)    Total Bilirubin 3.6 (*)    All other  components within normal limits  CBC - Abnormal; Notable for the following components:   MCH 34.3 (*)    RDW 11.3 (*)    All other components within normal limits  URINALYSIS, ROUTINE W REFLEX MICROSCOPIC - Abnormal; Notable for the following components:   Color, Urine YELLOW (*)    APPearance CLEAR (*)    Hgb urine dipstick SMALL (*)    Ketones, ur 20 (*)    Protein, ur 30 (*)    All other components within normal limits     EKG     RADIOLOGY  CT of the abdomen and pelvis shows no acute concerns.  Ultrasound of the right upper quadrant shows hepatic steatosis without any abnormality in the right upper quadrant.  I have independently reviewed and interpreted imaging as well as reviewed report from radiology.  PROCEDURES:  Critical Care performed: No  Procedures   MEDICATIONS ORDERED IN ED:  Medications  LORazepam (ATIVAN) tablet 1-4 mg (  Oral See Alternative 10/01/21 1321)    Or  LORazepam (ATIVAN) injection 1-4 mg (2 mg Intravenous Given 10/01/21 1321)  sodium chloride 0.9 % bolus 1,000 mL (1,000 mLs Intravenous New Bag/Given 10/01/21 0907)  iohexol (OMNIPAQUE) 300 MG/ML solution 100 mL (100 mLs Intravenous Contrast Given 10/01/21 0959)  ketorolac (TORADOL) 30 MG/ML injection 15 mg (15 mg Intravenous Given 10/01/21 1225)     IMPRESSION / MDM / ASSESSMENT AND PLAN / ED COURSE   I reviewed the triage vital signs and the nursing notes.  Differential diagnosis includes, but is not limited to: Pancreatitis, diverticulitis, ileus, small bowel obstruction  Patient's presentation is most consistent with acute presentation with potential threat to life or bodily function.  56 year old male presenting to the emergency department for treatment and evaluation of left upper quadrant pain.  See HPI for further details.  While awaiting ER room assignment, labs were drawn.  AST and ALT are elevated T. bili is 3.6.  Hyponatremic at 129 and a chloride of 92.  Lipase is mildly elevated  at 67.  CBC is reassuring.  Urinalysis shows small amount of ketones and protein.  Plan will be to get a CT of the abdomen and pelvis.  Patient aware and agreeable to plan.  Clinical Course as of 10/01/21 1336  Thu Oct 01, 2021  1123 ALT(!): 158 [KP]  1126 Bilirubin Urine: NEGATIVE [KP]  1126 Hgb urine dipstick(!): SMALL [KP]  1126 RBC / HPF: 0-5 [KP]  1129 Patient updated on results and plan for [place]. Pain well controlled at this time. [CT]  1237 Ultrasound is without concern for stone disease or biliary obstruction.  [CT]  1259 Patient very shaky and anxious.  After further discussion, he admits to drinking 10 or more beers per evening.  He feels that he is withdrawing.  CIWA protocol ordered. [CT]  1320 Patient evaluated by Patrick Barajas.  MRCP canceled at this time.  Liver disorder likely secondary to alcohol.  Plan will be to admit for fluids and withdrawal symptoms.  Hospitalist consult requested. [CT]    Clinical Course User Index [CT] Kenai Fluegel, Dessa Phi, FNP [KP] Harvest Dark, MD     FINAL CLINICAL IMPRESSION(S) / ED DIAGNOSES   Final diagnoses:  Alcohol withdrawal syndrome without complication [facility])  Liver dysfunction     Rx / DC Orders   ED Discharge Orders     None        Note:  This document was prepared using Dragon voice recognition software and may include unintentional dictation errors.   Victorino Dike, FNP 10/01/21 1336    Harvest Dark, MD 10/01/21 1425

## 2021-10-01 NOTE — Assessment & Plan Note (Addendum)
Patient presents to the ER for evaluation of abdominal pain mostly in the epigastrium and left upper quadrant and while in the ER developed symptoms of alcohol withdrawal which include tremulousness and anxiety. Continue CIWA protocol  Patient has been counseled on the need to abstain from further alcohol use and TOC has provided resources for AA

## 2021-10-01 NOTE — H&P (Addendum)
History and Physical    Patient: Patrick Barajas. FYB:[id number] DOB: 01-05-1966 DOA: 10/01/2021 DOS: the patient was seen and examined on 10/01/2021 PCP: Pcp, No  Patient coming from: Home  Chief Complaint:  Chief Complaint  Patient presents with   Abdominal Pain   HPI: Patrick Barajas. is a 56 y.o. male with medical history significant for alcohol abuse, anxiety disorder, hypertension, GERD who presents to the ER for evaluation of abdominal pain which started early on the day of admission. Pain is mostly in the epigastrium and left upper quadrant, it is nonradiating and he denies having any nausea, no vomiting or any changes in his bowel habits.  He rates his pain a 4 x 10 in intensity at its worst.  Patient denies having any chest pain, no shortness of breath, no urinary symptoms, no headache, no dizziness, no lightheadedness, no chest pain, no shortness of breath, no focal deficit or blurred vision. While in the ER patient developed symptoms related to alcohol withdrawal which included anxiety and tremulousness.  He admits to daily alcohol use and having symptoms of alcohol withdrawal when he does not drink.  His last drink was 1 day prior to his admission. He will be admitted to the hospital for further evaluation.   Review of Systems: As mentioned in the history of present illness. All other systems reviewed and are negative. Past Medical History:  Diagnosis Date   Anxiety    GERD (gastroesophageal reflux disease)    Hypertension    Past Surgical History:  Procedure Laterality Date   COLONOSCOPY WITH PROPOFOL N/A 10/20/2018   Procedure: COLONOSCOPY WITH PROPOFOL;  Surgeon: Lollie Sails, MD;  Location[facility] ENDOSCOPY;  Service: Endoscopy;  Laterality: N/A;   ESOPHAGOGASTRODUODENOSCOPY (EGD) WITH PROPOFOL N/A 10/20/2018   Procedure: ESOPHAGOGASTRODUODENOSCOPY (EGD) WITH PROPOFOL;  Surgeon: Lollie Sails, MD;  Location: [facility] ENDOSCOPY;  Service: Endoscopy;  Laterality:  N/A;   Social History:  reports that he has never smoked. His smokeless tobacco use includes snuff. He reports current alcohol use. He reports that he does not use drugs.  No Known Allergies  Family History  Problem Relation Age of Onset   Cancer Mother    Heart failure Father     Prior to Admission medications   Medication Sig Start Date End Date Taking? Authorizing Provider  citalopram (CELEXA) 20 MG tablet Take 1 tablet (20 mg total) by mouth daily. 11/17/20   Kennyth Arnold, FNP  indomethacin (INDOCIN) 50 MG capsule Take 1 capsule (50 mg total) by mouth 3 (three) times daily as needed. With meals 06/03/20   Tower, Wynelle Fanny, MD  lisinopril-hydrochlorothiazide (ZESTORETIC) 10-12.5 MG tablet Take 1 tablet by mouth daily. 10/07/20   Kennyth Arnold, FNP  Multiple Vitamins-Minerals (MULTIVITAMIN ADULT) TABS Take 1 tablet by mouth daily.    [provider]  omeprazole (PRILOSEC) 40 MG capsule Take 40 mg by mouth daily.    [provider]  simvastatin (ZOCOR) 20 MG tablet Take 20 mg by mouth at bedtime.   03/19/20  [provider]    Physical Exam: Vitals:   10/01/21 1032 10/01/21 1132 10/01/21 1306 10/01/21 1505  BP: 140/88 138/84 (!) 165/93 (!) 154/81  Pulse: 88 84 93 90  Resp: 16 18    Temp:      TempSrc:      SpO2: 97% 98%    Weight:      Height:      Physical Exam Vitals and nursing note  reviewed.  Constitutional:      Appearance: He is well-developed.     Comments: Appears anxious and tremulous  HENT:     Head: Normocephalic and atraumatic.     Mouth/Throat:     Mouth: Mucous membranes are moist.  Cardiovascular:     Rate and Rhythm: Normal rate and regular rhythm.  Pulmonary:     Effort: Pulmonary effort is normal.     Breath sounds: Normal breath sounds.  Abdominal:     General: Abdomen is flat. Bowel sounds are normal.     Palpations: Abdomen is soft.     Tenderness: There is abdominal tenderness in the epigastric area.  Skin:     General: Skin is warm and dry.  Neurological:     General: No focal deficit present.     Mental Status: He is alert.  Psychiatric:        Mood and Affect: Mood normal.        Behavior: Behavior normal.      Data Reviewed: Relevant notes from primary care and specialist visits, past discharge summaries as available in EHR, including Care Everywhere. Prior diagnostic testing as pertinent to current admission diagnoses Updated medications and problem lists for reconciliation ED course, including vitals, labs, imaging, treatment and response to treatment Triage notes, nursing and pharmacy notes and ED provider's notes Notable results as noted in HPI Labs reviewed.  Lipase 67, sodium 129, potassium 4.0, chloride 92, bicarb 22, glucose 98, BUN 7, creatinine 0.65, calcium 9.4, total protein 8.5, albumin 4.8, AST 189, ALT 158, alkaline phosphatase 56, white count 5.7, hemoglobin 15.4, hematocrit 42.8, RDW 11.3, platelet count 301 CT scan of abdomen and pelvis showed no acute CT findings of the abdomen or pelvis to explain left upper quadrant pain. Hepatic steatosis. Occasional sigmoid diverticula without evidence of acute diverticulitis. Right upper quadrant ultrasound showed Hepatic steatosis. No additional abnormality in the right upper quadrant. Twelve-lead EKG shows normal sinus rhythm with incomplete right bundle branch block There are no new results to review at this time.   Assessment and Plan: * Alcohol withdrawal ([place]) Patient presents to the ER for evaluation of abdominal pain mostly in the epigastrium and left upper quadrant and while in the ER developed symptoms of alcohol withdrawal which include tremulousness and anxiety. Place patient on CIWA protocol and administer lorazepam for CIWA score of 8 or greater Place patient on MVI/thiamine/folic acid Patient has been counseled on the need to abstain from further alcohol use  Hyponatremia Multifactorial most likely secondary to  hydrochlorothiazide use as well as Celexa. Hold hydrochlorothiazide for now We will continue patient's Celexa given his significant anxiety Gentle IV fluid hydration Obtain serum and urine osmolality as well as random urine sodium Repeat sodium levels in a.m.  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  All questions and concerns have been addressed.  He verbalizes understanding and agrees  with the plan.  Severity of Illness: The appropriate patient status for this patient is INPATIENT. Inpatient status is judged to be reasonable and necessary in order to provide the required intensity of service to ensure the patient's safety. The patient's presenting symptoms, physical exam findings, and initial radiographic and laboratory data in the context of their chronic comorbidities is felt to place them at high risk for further clinical deterioration. Furthermore, it is not anticipated that the patient will be medically stable for discharge from the hospital within 2 midnights of admission.   * I certify that at the point of admission it is my clinical judgment that the patient will require inpatient hospital care spanning beyond 2 midnights from the point of admission due to high intensity of service, high risk for further deterioration and  high frequency of surveillance required.*  Author: Collier Bullock, MD 10/01/2021 3:14 PM  For on call review [email or web address].

## 2021-10-02 DIAGNOSIS — K219 Gastro-esophageal reflux disease without esophagitis: Secondary | ICD-10-CM

## 2021-10-02 DIAGNOSIS — R109 Unspecified abdominal pain: Secondary | ICD-10-CM | POA: Diagnosis not present

## 2021-10-02 DIAGNOSIS — I10 Essential (primary) hypertension: Secondary | ICD-10-CM | POA: Diagnosis not present

## 2021-10-02 DIAGNOSIS — F10930 Alcohol use, unspecified with withdrawal, uncomplicated: Secondary | ICD-10-CM | POA: Diagnosis not present

## 2021-10-02 LAB — BASIC METABOLIC PANEL
Anion gap: 8 (ref 5–15)
BUN: 15 mg/dL (ref 6–20)
CO2: 28 mmol/L (ref 22–32)
Calcium: 9.4 mg/dL (ref 8.9–10.3)
Chloride: 98 mmol/L (ref 98–111)
Creatinine, Ser: 0.78 mg/dL (ref 0.61–1.24)
GFR, Estimated: >60 mL/min (ref >60)
Glucose, Bld: 106 mg/dL — ABNORMAL HIGH (ref 70–99)
Potassium: 3.5 mmol/L (ref 3.5–5.1)
Sodium: 134 mmol/L — ABNORMAL LOW (ref 135–145)

## 2021-10-02 LAB — CBC
HCT: 41.5 % (ref 39.0–52.0)
Hemoglobin: 14.7 g/dL (ref 13.0–17.0)
MCH: 34.3 pg — ABNORMAL HIGH (ref 26.0–34.0)
MCHC: 35.4 g/dL (ref 30.0–36.0)
MCV: 97 fL (ref 80.0–100.0)
Platelets: 290 10*3/uL (ref 150–400)
RBC: 4.28 MIL/uL (ref 4.22–5.81)
RDW: 11.3 % — ABNORMAL LOW (ref 11.5–15.5)
WBC: 6 10*3/uL (ref 4.0–10.5)
nRBC: 0 % (ref 0.0–0.2)

## 2021-10-02 MED ORDER — LISINOPRIL 10 MG PO TABS
10.0000 mg | ORAL_TABLET | Freq: Every day | ORAL | Status: DC
  Administered 2021-10-02 – 2021-10-03 (×2): 10 mg via ORAL
  Filled 2021-10-02 (×2): qty 1

## 2021-10-02 NOTE — Progress Notes (Signed)
  Progress Note   Patient: Patrick Barajas. TIR:[id number] DOB: October 17, 1965 DOA: 10/01/2021     1 DOS: the patient was seen and examined on 10/02/2021   Brief hospital course: 56 y.o. male with medical history significant for alcohol abuse, anxiety disorder, hypertension, GERD admitted for alcohol withdrawal  6/9: Still somewhat jittery and has withdrawal symptoms.  On CIWA   Assessment and Plan: * Alcohol withdrawal ([place]) Patient presents to the ER for evaluation of abdominal pain mostly in the epigastrium and left upper quadrant and while in the ER developed symptoms of alcohol withdrawal which include tremulousness and anxiety. Continue CIWA protocol  Patient has been counseled on the need to abstain from further alcohol use and TOC has provided resources for AA  Hyponatremia Likely due to HCTZ.  Sodium improved with stopping HCTZ.  134 today  GERD (gastroesophageal reflux disease) Switch to oral Protonix from IV  HTN (hypertension) Hold lisinopril/hydrochlorothiazide for now Continue low-dose clonidine which will also help with his withdrawal symptoms  Abdominal pain Likely secondary to alcoholic gastritis Continue PPI as ordered Patient advised to abstain from further alcohol use  Anxiety Patient with a history of anxiety disorder worsened now by alcohol withdrawal Continue lorazepam per alcohol withdrawal protocol Continue Celexa.        Subjective: Still somewhat tremulous and seem to be withdrawing.  Upon ambulation he became tachycardic per nursing  Physical Exam: Vitals:   10/02/21 0010 10/02/21 0535 10/02/21 0828 10/02/21 1216  BP: (!) 149/97 (!) 128/91 (!) 140/95 (!) 124/109  Pulse: 67 65 (!) 57 69  Resp: '18 18 13 19  '$ Temp: 98.1 F (36.7 C) 98.1 F (36.7 C) 98.3 F (36.8 C) 98.4 F (36.9 C)  TempSrc: Oral Oral Oral   SpO2: 98% 95% 97% 98%  Weight:      Height:       56 year old male lying in the bed somewhat tremulous Lungs clear to auscultation  bilaterally Heart regular rate and rhythm Abdomen soft, benign Neuro alert and oriented, nonfocal.  Somewhat tremulous Psych normal mood and affect  Data Reviewed:   Sodium 134  Family Communication: None  Disposition: Status is: Inpatient Remains inpatient appropriate because: Alcohol withdrawal management   Planned Discharge Destination: Home    DVT prophylaxis-Lovenox Time spent: 35 minutes  Author: Max Sane, MD 10/02/2021 1:55 PM  For on call review [email or web address].

## 2021-10-02 NOTE — Hospital Course (Signed)
56 y.o. male with medical history significant for alcohol abuse, anxiety disorder, hypertension, GERD admitted for alcohol withdrawal  6/9: Still somewhat jittery and has withdrawal symptoms.  On CIWA

## 2021-10-02 NOTE — TOC Initial Note (Addendum)
Transition of Care (TOC) - Initial/Assessment Note    Patient Details  Name: Patrick Barajas. MRN: [id number] Date of Birth: 1966/01/09  Transition of Care [facility]) CM/SW Contact:    Laurena Slimmer, RN Phone Number: 10/02/2021, 1:11 PM  Clinical Narrative:                 Spoke with patient at bedside to give substance abuse resources.         Patient Goals and CMS Choice        Expected Discharge Plan and Services                                                Prior Living Arrangements/Services                       Activities of Daily Living Home Assistive Devices/Equipment: Dentures (specify type), Eyeglasses ADL Screening (condition at time of admission) Patient's cognitive ability adequate to safely complete daily activities?: Yes Is the patient deaf or have difficulty hearing?: No Does the patient have difficulty seeing, even when wearing glasses/contacts?: No Does the patient have difficulty concentrating, remembering, or making decisions?: No Patient able to express need for assistance with ADLs?: Yes Does the patient have difficulty dressing or bathing?: No Independently performs ADLs?: Yes (appropriate for developmental age) Does the patient have difficulty walking or climbing stairs?: No Weakness of Legs: None Weakness of Arms/Hands: None  Permission Sought/Granted                  Emotional Assessment              Admission diagnosis:  Alcohol withdrawal ([place]) [F10.939] Liver dysfunction [K76.89] Alcohol withdrawal syndrome without complication ([place]) [X10.626] Patient Active Problem List   Diagnosis Date Noted   Alcohol withdrawal ([place]) 10/01/2021   Hyponatremia 10/01/2021   Abdominal pain 10/01/2021   Gout 06/03/2020   Tobacco use 09/01/2019   Anxiety 08/31/2019   GERD (gastroesophageal reflux disease) 08/31/2019   HTN (hypertension) 08/31/2019   Hypertriglyceridemia 08/31/2019   PCP:  Pcp, No Pharmacy:    [id number] [place] [address] [place]Phone: [phone]: [phone] [id number] [address]hone: [phone]: [phone]   Social Determinants of Health (SDOH) Interventions    Readmission Risk Interventions     No data to display

## 2021-10-03 DIAGNOSIS — R109 Unspecified abdominal pain: Secondary | ICD-10-CM | POA: Diagnosis not present

## 2021-10-03 DIAGNOSIS — F419 Anxiety disorder, unspecified: Secondary | ICD-10-CM

## 2021-10-03 DIAGNOSIS — F10930 Alcohol use, unspecified with withdrawal, uncomplicated: Secondary | ICD-10-CM | POA: Diagnosis not present

## 2021-10-03 DIAGNOSIS — K219 Gastro-esophageal reflux disease without esophagitis: Secondary | ICD-10-CM | POA: Diagnosis not present

## 2021-10-03 MED ORDER — FOLIC ACID 1 MG PO TABS
1.0000 mg | ORAL_TABLET | Freq: Every day | ORAL | 0 refills | Status: AC
Start: 2021-10-03 — End: 2021-11-02

## 2021-10-03 MED ORDER — ADULT MULTIVITAMIN W/MINERALS CH: 1.0000 | ORAL_TABLET | Freq: Every day | ORAL | 0 refills | Status: AC

## 2021-10-03 MED ORDER — THIAMINE HCL 100 MG PO TABS: 100.0000 mg | ORAL_TABLET | Freq: Every day | ORAL | 0 refills | Status: AC

## 2021-10-03 NOTE — Discharge Summary (Addendum)
Physician Discharge Summary   Patient: Patrick Barajas. MRN: [id number] DOB: 1966-03-14  Admit date:     10/01/2021  Discharge date: 10/03/21  Discharge Physician: Max Sane   PCP: Sallee Lange, NP   Recommendations at discharge:    Follow-up with outpatient providers as requested  Discharge Diagnoses: Principal Problem:   Alcohol withdrawal ([place]) Active Problems:   GERD (gastroesophageal reflux disease)   Hyponatremia   HTN (hypertension)   Anxiety   Abdominal pain  Hospital Course: 56 y.o. male with medical history significant for alcohol abuse, anxiety disorder, hypertension, GERD admitted for alcohol withdrawal  6/9: Still somewhat jittery and has withdrawal symptoms.  On CIWA  Assessment and Plan: * Alcohol withdrawal ([place]) Treated with CIWA protocol while in the hospital.  Symptoms are resolved now and patient is back to baseline Patient has been counseled on the need to abstain from further alcohol use and TOC has provided resources for AA  Hyponatremia Likely due to HCTZ.  Sodium improved with holding HCTZ.    GERD (gastroesophageal reflux disease) PPI  HTN (hypertension) Controlled  Abdominal pain Likely secondary to alcoholic gastritis Now resolved Patient advised to abstain from further alcohol use  Anxiety Patient with a history of anxiety disorder worsened now by alcohol withdrawal Continue Celexa.  Outpatient follow-up with his PCP and/or psychiatry          Disposition: Home Diet recommendation:  Discharge Diet Orders (From admission, onward)     Start     Ordered   10/03/21 0000  Diet - low sodium heart healthy        10/03/21 0820           Cardiac diet DISCHARGE MEDICATION: Allergies as of 10/03/2021   No Known Allergies      Medication List     STOP taking these medications    indomethacin 50 MG capsule Commonly known as: INDOCIN       TAKE these medications    citalopram 20 MG tablet Commonly known  as: CELEXA Take 1 tablet (20 mg total) by mouth daily.   folic acid 1 MG tablet Commonly known as: FOLVITE Take 1 tablet (1 mg total) by mouth daily.   lisinopril-hydrochlorothiazide 10-12.5 MG tablet Commonly known as: ZESTORETIC Take 1 tablet by mouth daily.   multivitamin with minerals Tabs tablet Take 1 tablet by mouth daily.   omeprazole 40 MG capsule Commonly known as: PRILOSEC Take 40 mg by mouth daily.   thiamine 100 MG tablet Take 1 tablet (100 mg total) by mouth daily.        Follow-up Information     Gauger, Victoriano Lain, NP. Schedule an appointment as soon as possible for a visit in 1 week(s).   Specialty: Internal Medicine Why: [facility] Discharge F/UP Contact information: [place] [address] [phone]                Discharge Exam: Danley Danker Weights   10/01/21 0728  Weight: 95.49 kg   56 year old male walking around in the room without any acute distress Lungs clear to auscultation bilaterally Heart regular rate and rhythm Abdomen soft, benign Neuro alert and oriented, nonfocal.   Psych normal mood and affect  Condition at discharge: good  The results of significant diagnostics from this hospitalization (including imaging, microbiology, ancillary and laboratory) are listed below for reference.   Imaging Studies: US ABDOMEN LIMITED RUQ (LIVER/GB)  Result Date: 10/01/2021 CLINICAL DATA:  Elevated liver enzymes EXAM: ULTRASOUND  ABDOMEN LIMITED RIGHT UPPER QUADRANT COMPARISON:  08/26/2018 FINDINGS: Gallbladder: No gallstones or wall thickening visualized. No sonographic Murphy sign noted by sonographer. Common bile duct: Diameter: 5 mm Liver: No focal lesion identified. Increased parenchymal echogenicity. Portal vein is patent on color Doppler imaging with normal direction of blood flow towards the liver. Other: None. IMPRESSION: Hepatic steatosis. No additional abnormality in the right upper quadrant. Electronically Signed    By: Merilyn Baba M.D.   On: 10/01/2021 12:06   CT ABDOMEN PELVIS W CONTRAST  Result Date: 10/01/2021 CLINICAL DATA:  Sharp left upper quadrant abdominal pain since this morning EXAM: CT ABDOMEN AND PELVIS WITH CONTRAST TECHNIQUE: Multidetector CT imaging of the abdomen and pelvis was performed using the standard protocol following bolus administration of intravenous contrast. RADIATION DOSE REDUCTION: This exam was performed according to the departmental dose-optimization program which includes automated exposure control, adjustment of the mA and/or kV according to patient size and/or use of iterative reconstruction technique. CONTRAST:  145m OMNIPAQUE IOHEXOL 300 MG/ML  SOLN COMPARISON:  09/14/2018 FINDINGS: Lower chest: No acute abnormality. Hepatobiliary: No solid liver abnormality is seen. Hepatic steatosis. No gallstones, gallbladder wall thickening, or biliary dilatation. Pancreas: Unremarkable. No pancreatic ductal dilatation or surrounding inflammatory changes. Spleen: Normal in size without significant abnormality. Adrenals/Urinary Tract: Stable, definitively benign left adrenal adenoma measuring 2.1 x 1.7 cm (series 2, image 26). Kidneys are normal, without renal calculi, solid lesion, or hydronephrosis. Bladder is unremarkable. Stomach/Bowel: Stomach is within normal limits. Appendix appears normal. No evidence of bowel wall thickening, distention, or inflammatory changes. Occasional sigmoid diverticula. Vascular/Lymphatic: Aortic atherosclerosis. No enlarged abdominal or pelvic lymph nodes. Reproductive: No mass or other significant abnormality. Other: No abdominal wall hernia or abnormality. No ascites. Musculoskeletal: No acute or significant osseous findings. IMPRESSION: 1. No acute CT findings of the abdomen or pelvis to explain left upper quadrant pain. 2. Hepatic steatosis. 3. Occasional sigmoid diverticula without evidence of acute diverticulitis. Aortic Atherosclerosis (ICD10-I70.0).  Electronically Signed   By: ADelanna AhmadiM.D.   On: 10/01/2021 10:15    Microbiology: Results for orders placed or performed during the hospital encounter of 11/19/19  SARS CORONAVIRUS 2 (TAT 6-24 HRS) Nasopharyngeal Nasopharyngeal Swab     Status: Abnormal   Collection Time: 11/19/19 11:02 AM   Specimen: Nasopharyngeal Swab  Result Value Ref Range Status   SARS Coronavirus 2 POSITIVE (A) NEGATIVE Final    Comment: EMAILED [place] 08/21/2019 BY MESSA H. (NOTE) SARS-CoV-2 target nucleic acids are DETECTED.  The SARS-CoV-2 RNA is generally detectable in upper and lower respiratory specimens during the acute phase of infection. Positive results are indicative of the presence of SARS-CoV-2 RNA. Clinical correlation with patient history and other diagnostic information is  necessary to determine patient infection status. Positive results do not rule out bacterial infection or co-infection with other viruses.  The expected result is Negative.  Fact Sheet for Patients: hSugarRoll.be Fact Sheet for Healthcare Providers: hhttps://www.woods-mathews.com/ This test is not yet approved or cleared by the [place] and  has been authorized for detection and/or diagnosis of SARS-CoV-2 by FDA under an Emergency Use Authorization (EUA). This EUA will remain  in effect (meaning this test can be used) for the duration of the COVID-19  declaration under Section 564(b)(1) of the Act, 21 U.S.C. section 360bbb-3(b)(1), unless the authorization is terminated or revoked sooner.   Performed at [facility] [address], [place] [address]  Labs: CBC: Recent Labs  Lab 10/01/21 0738 10/02/21 0550  WBC 5.7 6.0  HGB 15.4 14.7  HCT 42.8 41.5  MCV 95.3 97.0  PLT 301 277   Basic Metabolic Panel: Recent Labs  Lab 10/01/21 0738 10/02/21 0550  NA 129* 134*  K 4.0 3.5  CL 92* 98  CO2 22 28  GLUCOSE 98 106*  BUN  7 15  CREATININE 0.65 0.78  CALCIUM 9.4 9.4   Liver Function Tests: Recent Labs  Lab 10/01/21 0738  AST 189*  ALT 158*  ALKPHOS 56  BILITOT 3.6*  PROT 8.5*  ALBUMIN 4.8   CBG: No results for input(s): "GLUCAP" in the last 168 hours.  Discharge time spent: greater than 30 minutes.  Signed: Max Sane, MD [facility] 10/03/2021

## 2021-11-30 ENCOUNTER — Other Ambulatory Visit: Payer: Self-pay | Admitting: Family

## 2021-11-30 DIAGNOSIS — F419 Anxiety disorder, unspecified: Secondary | ICD-10-CM

## 2021-12-10 ENCOUNTER — Telehealth: Payer: Self-pay | Admitting: Nurse Practitioner

## 2021-12-10 ENCOUNTER — Other Ambulatory Visit: Payer: Self-pay | Admitting: Nurse Practitioner

## 2021-12-10 DIAGNOSIS — F419 Anxiety disorder, unspecified: Secondary | ICD-10-CM

## 2021-12-10 DIAGNOSIS — K219 Gastro-esophageal reflux disease without esophagitis: Secondary | ICD-10-CM

## 2021-12-10 DIAGNOSIS — I10 Essential (primary) hypertension: Secondary | ICD-10-CM

## 2021-12-10 MED ORDER — CITALOPRAM HYDROBROMIDE 20 MG PO TABS: 20.0000 mg | ORAL_TABLET | Freq: Every day | ORAL | 0 refills | Status: DC

## 2021-12-10 MED ORDER — LISINOPRIL-HYDROCHLOROTHIAZIDE 10-12.5 MG PO TABS: 1.0000 | ORAL_TABLET | Freq: Every day | ORAL | 0 refills | Status: DC

## 2021-12-10 NOTE — Telephone Encounter (Signed)
  Encourage patient to contact the pharmacy for refills or they can request refills through [facility]  Did the patient contact the pharmacy: Yes  LAST APPOINTMENT DATE: N/A  NEXT APPOINTMENT DATE: 12/17/21  MEDICATION: lisinopril-hydrochlorothiazide (ZESTORETIC) 10-12.5 MG tablet  citalopram (CELEXA) 20 MG tablet  Is the patient out of medication? Yes  Is this a 90 day supply: Yes  [facility]ACY[id number] [place] [place] - [address]  Comment: [facility] His previous PCP has left and he needs his medication. Thank you!  Let patient know to contact pharmacy at the end of the day to make sure medication is ready.  Please notify patient to allow 48-72 hours to process

## 2021-12-10 NOTE — Telephone Encounter (Signed)
Sent 30 days of each medication. Will not refill any more medication until I see him in office

## 2021-12-10 NOTE — Addendum Note (Signed)
Addended by: Michela Pitcher on: 12/10/2021 02:01 PM   Modules accepted: Orders

## 2021-12-10 NOTE — Telephone Encounter (Signed)
Patient advised.

## 2021-12-17 ENCOUNTER — Ambulatory Visit (INDEPENDENT_AMBULATORY_CARE_PROVIDER_SITE_OTHER): Payer: BC Managed Care – PPO | Admitting: Nurse Practitioner

## 2021-12-17 ENCOUNTER — Encounter: Payer: Self-pay | Admitting: Nurse Practitioner

## 2021-12-17 ENCOUNTER — Encounter: Payer: BC Managed Care – PPO | Admitting: Nurse Practitioner

## 2021-12-17 VITALS — BP 108/72 | HR 63 | Temp 97.1°F | Resp 14 | Ht 71.0 in | Wt 189.0 lb

## 2021-12-17 DIAGNOSIS — I10 Essential (primary) hypertension: Secondary | ICD-10-CM | POA: Diagnosis not present

## 2021-12-17 DIAGNOSIS — F10930 Alcohol use, unspecified with withdrawal, uncomplicated: Secondary | ICD-10-CM

## 2021-12-17 DIAGNOSIS — E781 Pure hyperglyceridemia: Secondary | ICD-10-CM

## 2021-12-17 DIAGNOSIS — K219 Gastro-esophageal reflux disease without esophagitis: Secondary | ICD-10-CM | POA: Diagnosis not present

## 2021-12-17 DIAGNOSIS — F419 Anxiety disorder, unspecified: Secondary | ICD-10-CM | POA: Diagnosis not present

## 2021-12-17 DIAGNOSIS — Z720 Tobacco use: Secondary | ICD-10-CM

## 2021-12-17 DIAGNOSIS — M109 Gout, unspecified: Secondary | ICD-10-CM

## 2021-12-17 DIAGNOSIS — Z0000 Encounter for general adult medical examination without abnormal findings: Secondary | ICD-10-CM | POA: Insufficient documentation

## 2021-12-17 DIAGNOSIS — Z7689 Persons encountering health services in other specified circumstances: Secondary | ICD-10-CM

## 2021-12-17 LAB — CBC
HCT: 41.5 % (ref 39.0–52.0)
Hemoglobin: 14.2 g/dL (ref 13.0–17.0)
MCHC: 34.1 g/dL (ref 30.0–36.0)
MCV: 97.2 fl (ref 78.0–100.0)
Platelets: 385 10*3/uL (ref 150.0–400.0)
RBC: 4.27 Mil/uL (ref 4.22–5.81)
RDW: 12.7 % (ref 11.5–15.5)
WBC: 7.7 10*3/uL (ref 4.0–10.5)

## 2021-12-17 LAB — COMPREHENSIVE METABOLIC PANEL
ALT: 15 U/L (ref 0–53)
AST: 18 U/L (ref 0–37)
Albumin: 4.4 g/dL (ref 3.5–5.2)
Alkaline Phosphatase: 62 U/L (ref 39–117)
BUN: 8 mg/dL (ref 6–23)
CO2: 29 mEq/L (ref 19–32)
Calcium: 9.7 mg/dL (ref 8.4–10.5)
Chloride: 99 mEq/L (ref 96–112)
Creatinine, Ser: 0.96 mg/dL (ref 0.40–1.50)
GFR: 89.9 mL/min (ref >60.00)
Glucose, Bld: 93 mg/dL (ref 70–99)
Potassium: 4.1 mEq/L (ref 3.5–5.1)
Sodium: 137 mEq/L (ref 135–145)
Total Bilirubin: 2.5 mg/dL — ABNORMAL HIGH (ref 0.2–1.2)
Total Protein: 7.5 g/dL (ref 6.0–8.3)

## 2021-12-17 LAB — LIPID PANEL
Cholesterol: 247 mg/dL — ABNORMAL HIGH (ref 0–200)
HDL: 43.5 mg/dL (ref >39.00)
NonHDL: 203.17
Total CHOL/HDL Ratio: 6
Triglycerides: 213 mg/dL — ABNORMAL HIGH (ref 0.0–149.0)
VLDL: 42.6 mg/dL — ABNORMAL HIGH (ref 0.0–40.0)

## 2021-12-17 LAB — TSH: TSH: 1.86 u[IU]/mL (ref 0.35–5.50)

## 2021-12-17 LAB — LDL CHOLESTEROL, DIRECT: Direct LDL: 166 mg/dL

## 2021-12-17 NOTE — Assessment & Plan Note (Signed)
Request that patient continue PPI therapy today of the year.  He did have endoscopy back in 2020 that did show an irregular Z-line.  Several bleeding erosions were noticed and a small hiatal hernia.  Patient currently omeprazole 20 mg daily

## 2021-12-17 NOTE — Assessment & Plan Note (Signed)
Was evaluated in the hospital setting and discharge.  Patient has been alcohol free for 2 months

## 2021-12-17 NOTE — Assessment & Plan Note (Signed)
Patient uses smokeless tobacco

## 2021-12-17 NOTE — Assessment & Plan Note (Signed)
Patient currently maintained on lisinopril-HCTZ.  States he is abstaining from alcohol his blood pressures been much better.  Within normal limits today.  Did give signs and symptoms as when his blood pressure may be too low.  We will continue to monitor if he starts having symptoms of lightheadedness or orthostasis can either stop medication altogether or remove one of the drugs.

## 2021-12-17 NOTE — Assessment & Plan Note (Signed)
Currently on citalopram daily.  Patient asked about coming off.  I told him was given a little more time as he recently just quit alcohol altogether also had some more time in between and we feel he is stable we can work on weaning the patient off.  Do not want make to any changes at 1 time to jeopardize his success

## 2021-12-17 NOTE — Progress Notes (Signed)
New Patient Office Visit  Subjective    Patient ID: Patrick Elman., male    DOB: 1966/01/01  Age: 56 y.o. MRN: [id number]  CC:  Chief Complaint  Patient presents with   Transfer of Care   Annual Exam    HPI Patrick Barajas. presents to establish care  HTN: Checks blood pressure at home. States that he started checking agagin after he stop drinking. States 2 months ago.   GERD: Omepraxzole one a day.  Patient has had an endoscopy done on 10/20/2018.  Did review.  States he was taking omeprazole 20 mg twice daily since stopping alcohol use he is went back down to omeprazole 20 mg daily  Alcohol use: States that he has had withdrawls before. He will get sweats shakes and insomnia. Never had a withdrawal seizures.  This last time he did go to the hospital to help him detox.  Patient has not had any alcohol for approximately 2 months.  Not currently on any community programs for support such as AA.  States his son is his motivating factor  Gout: was alcohol induced. States the last time was a few months ago.  Has abstained from alcohol for approximate 2 months no current concern for gout per patient report.  Hypertriglyceridemia: Patient has been maintained on fish oils.  Depression: Has been on citalopram for years.  Patient states he did go approximate 4 days without the medication and felt fine.  He is curious about coming off the medication as he feels like he does not need it.  PSA: Due  Colonoscopy: 2020, reviewed in chart  LDCT: N/A does lose  TDAP: Up-to-date Covid: refused Shingles: information given Outpatient Encounter Medications as of 12/17/2021  Medication Sig   citalopram (CELEXA) 20 MG tablet Take 1 tablet (20 mg total) by mouth daily.   lisinopril-hydrochlorothiazide (ZESTORETIC) 10-12.5 MG tablet Take 1 tablet by mouth daily.   Multiple Vitamin (MULTIVITAMIN) tablet Take 1 tablet by mouth daily.   Omega-3 Fatty Acids (FISH OIL) 1000 MG CAPS Take by mouth. 2  daily   omeprazole (PRILOSEC) 40 MG capsule Take 40 mg by mouth daily.   [DISCONTINUED] simvastatin (ZOCOR) 20 MG tablet Take 20 mg by mouth at bedtime.    No facility-administered encounter medications on file as of 12/17/2021.    Past Medical History:  Diagnosis Date   Anxiety    GERD (gastroesophageal reflux disease)    Hypertension     Past Surgical History:  Procedure Laterality Date   COLONOSCOPY WITH PROPOFOL N/A 10/20/2018   Procedure: COLONOSCOPY WITH PROPOFOL;  Surgeon: Lollie Sails, MD;  [facility];  Service: Endoscopy;  Laterality: N/A;   ESOPHAGOGASTRODUODENOSCOPY (EGD) WITH PROPOFOL N/A 10/20/2018   Procedure: ESOPHAGOGASTRODUODENOSCOPY (EGD) WITH PROPOFOL;  Surgeon: Lollie Sails, MD;  Location: [facility] ENDOSCOPY;  Service: Endoscopy;  Laterality: N/A;    Family History  Problem Relation Age of Onset   Breast cancer Mother    Stroke Father    Heart attack Father    Healthy Brother    Lung cancer Maternal Aunt    Liver cancer Maternal Aunt    Pancreatic cancer Maternal Uncle    Bone cancer Maternal Grandmother     Social History   Socioeconomic History   Marital status: Significant Other    Spouse name: Not on file   Number of children: 1   Years of education: Not on file   Highest education level: Not on file  Occupational  History   Not on file  Tobacco Use   Smoking status: Never   Smokeless tobacco: Current    Types: Snuff  Vaping Use   Vaping Use: Never used  Substance and Sexual Activity   Alcohol use: Not Currently    Comment: 10/01/2021 quit alcohol-was drinking about 20 12oz beer cans before then   Drug use: No   Sexual activity: Not on file  Other Topics Concern   Not on file  Social History Narrative   Jerline Pain son who is 8      Fulltime: [employer]   Social Determinants of Radio broadcast assistant Strain: Not on file  Food Insecurity: Not on file  Transportation Needs: Not on file  Physical Activity: Not on  file  Stress: Not on file  Social Connections: Not on file  Intimate Partner Violence: Not on file    Review of Systems  Constitutional:  Negative for chills and fever.  Respiratory:  Negative for shortness of breath.   Cardiovascular:  Negative for chest pain and leg swelling.  Gastrointestinal:  Negative for abdominal pain, diarrhea, nausea and vomiting.       BP daily   Genitourinary:  Negative for dysuria and hematuria.  Neurological:  Negative for tingling and headaches.  Psychiatric/Behavioral:  Negative for hallucinations and suicidal ideas.         Objective    BP 108/72   Pulse 63   Temp (!) 97.1 F (36.2 C)   Resp 14   Ht '5\' 11"'$  (1.803 m)   Wt 189 lb (85.7 kg)   SpO2 97%   BMI 26.36 kg/m   Physical Exam Vitals and nursing note reviewed.  Constitutional:      Appearance: Normal appearance.  HENT:     Right Ear: Tympanic membrane, ear canal and external ear normal.     Left Ear: Tympanic membrane, ear canal and external ear normal.     Mouth/Throat:     Mouth: Mucous membranes are moist.     Pharynx: Oropharynx is clear.  Eyes:     Extraocular Movements: Extraocular movements intact.     Pupils: Pupils are equal, round, and reactive to light.  Cardiovascular:     Rate and Rhythm: Normal rate and regular rhythm.     Pulses: Normal pulses.     Heart sounds: Normal heart sounds.  Pulmonary:     Effort: Pulmonary effort is normal.     Breath sounds: Normal breath sounds.  Abdominal:     General: Bowel sounds are normal. There is no distension.     Palpations: There is no mass.     Tenderness: There is no abdominal tenderness.     Hernia: No hernia is present.  Musculoskeletal:     Right lower leg: No edema.     Left lower leg: No edema.  Lymphadenopathy:     Cervical: No cervical adenopathy.  Skin:    General: Skin is warm.  Neurological:     General: No focal deficit present.     Mental Status: He is alert.     Deep Tendon Reflexes:     Reflex  Scores:      Bicep reflexes are 2+ on the right side and 2+ on the left side.      Patellar reflexes are 2+ on the right side and 2+ on the left side.    Comments: Bilateral upper and lower extremity strength 5/5  Psychiatric:        Mood  and Affect: Mood normal.        Behavior: Behavior normal.        Thought Content: Thought content normal.        Judgment: Judgment normal.         Assessment & Plan:   Problem List Items Addressed This Visit       Cardiovascular and Mediastinum   HTN (hypertension)    Patient currently maintained on lisinopril-HCTZ.  States he is abstaining from alcohol his blood pressures been much better.  Within normal limits today.  Did give signs and symptoms as when his blood pressure may be too low.  We will continue to monitor if he starts having symptoms of lightheadedness or orthostasis can either stop medication altogether or remove one of the drugs.      Relevant Orders   CBC   Comprehensive metabolic panel   TSH     Digestive   GERD (gastroesophageal reflux disease)    Request that patient continue PPI therapy today of the year.  He did have endoscopy back in 2020 that did show an irregular Z-line.  Several bleeding erosions were noticed and a small hiatal hernia.  Patient currently omeprazole 20 mg daily        Other   Anxiety - Primary    Currently on citalopram daily.  Patient asked about coming off.  I told him was given a little more time as he recently just quit alcohol altogether also had some more time in between and we feel he is stable we can work on weaning the patient off.  Do not want make to any changes at 1 time to jeopardize his success      Relevant Orders   TSH   Hypertriglyceridemia    Pending labs today.  Patient on omega-3 fatty acids      Relevant Orders   Lipid panel   Tobacco use    Patient uses smokeless tobacco      Gout    Alcohol induced per patient report.  He has quit drinking alcohol.      Alcohol  withdrawal ([place])    Was evaluated in the hospital setting and discharge.  Patient has been alcohol free for 2 months      Establishing care with new doctor, encounter for    Return in about 6 months (around 06/19/2022) for CPE with labs.   Romilda Garret, NP

## 2021-12-17 NOTE — Assessment & Plan Note (Signed)
Alcohol induced per patient report.  He has quit drinking alcohol.

## 2021-12-17 NOTE — Patient Instructions (Signed)
Nice to see you today I will be in touch with the lab results Continue keeping up the good work Check blood pressure approx 3 times a week.  Follow up with me in 6 months for your physical, sooner if you need me

## 2021-12-17 NOTE — Assessment & Plan Note (Signed)
Pending labs today.  Patient on omega-3 fatty acids

## 2022-01-02 ENCOUNTER — Other Ambulatory Visit: Payer: Self-pay | Admitting: Nurse Practitioner

## 2022-01-02 DIAGNOSIS — F419 Anxiety disorder, unspecified: Secondary | ICD-10-CM

## 2022-01-02 DIAGNOSIS — I10 Essential (primary) hypertension: Secondary | ICD-10-CM

## 2022-01-06 MED ORDER — OMEPRAZOLE 40 MG PO CPDR: 40.0000 mg | DELAYED_RELEASE_CAPSULE | Freq: Every day | ORAL | 1 refills | Status: DC

## 2022-01-06 NOTE — Telephone Encounter (Signed)
Patient advised.

## 2022-01-06 NOTE — Telephone Encounter (Signed)
If he is having break through symptoms we can go back on the omeprazole 40 mg daily. He will need to stop the '20mg'$  daily. I will send in a script for the '40mg'$  daily. WE will do that for a couple months and then try to step him down

## 2022-01-06 NOTE — Telephone Encounter (Signed)
Pt returned call

## 2022-01-06 NOTE — Telephone Encounter (Signed)
Left message to call back  

## 2022-01-06 NOTE — Telephone Encounter (Signed)
Pt called in request a call back regarding medication concerns . Please Advise [phone]

## 2022-01-06 NOTE — Telephone Encounter (Signed)
Patient states when he went to get Omeprazole from the pharmacy about 2 weeks ago or so pharmacist told him Omeprazole Magnesium was better and to try that and its 20 mg. He was taking Omeprazole 40 mg 1 daily all this time. He has not had any problem with heartburn in years but since the switch has been having some abdominal pain, indigestion, burping. Wonders if its from the switch and if just needs to go back to what he was taking all these years.

## 2022-01-06 NOTE — Addendum Note (Signed)
Addended by: Michela Pitcher on: 01/06/2022 01:27 PM   Modules accepted: Orders

## 2022-02-01 ENCOUNTER — Other Ambulatory Visit: Payer: Self-pay | Admitting: Nurse Practitioner

## 2022-02-01 DIAGNOSIS — K219 Gastro-esophageal reflux disease without esophagitis: Secondary | ICD-10-CM

## 2022-03-03 ENCOUNTER — Telehealth: Payer: Self-pay | Admitting: Nurse Practitioner

## 2022-03-03 NOTE — Telephone Encounter (Signed)
Patient has had a history of needing hospitalization for withdrawal in the past. Need to know how much he is drinking and for how long. Last drink and if he is having any symptoms of alcohol withdrawal symptoms

## 2022-03-03 NOTE — Telephone Encounter (Signed)
Patient called in and stated that he was seen by [facility] a few months ago. He stated that he was told if he start back drinking to call [facility] and he would prescribe something for him. He said that he has been drinking the past few days due to work stress and wasn't sure if he need the medication. Please advise. Thank you!

## 2022-03-03 NOTE — Telephone Encounter (Addendum)
I spoke with pt; pt said 2 - 3 days ago started drinking few shots of liquor; no beer.pt having stress at work.  Pt had stopped drinking June 8 . Pt thinks he is OK but Sonia Baller is concerned he will go back to drinking a lot again. Pt said he wants to know if there is a med to stop the urge to drink. ;pt had last drink last night. No SI/HI. Pt denies any withdrawal symptoms (no H/A, anxiety, shakiness).[facility]. Sending note to Romilda Garret NP.

## 2022-03-04 NOTE — Telephone Encounter (Signed)
Patient advised and scheduled for 03/11/22 when patient will be off work next

## 2022-03-04 NOTE — Telephone Encounter (Signed)
They do make medication to help with the craving. Can we get in an appointment within the next week to discuss the meds and getting him the help he needs please

## 2022-03-11 ENCOUNTER — Ambulatory Visit: Payer: BC Managed Care – PPO | Admitting: Nurse Practitioner

## 2022-06-24 ENCOUNTER — Encounter: Payer: BC Managed Care – PPO | Admitting: Nurse Practitioner

## 2022-07-06 ENCOUNTER — Other Ambulatory Visit: Payer: Self-pay | Admitting: Nurse Practitioner

## 2022-07-06 DIAGNOSIS — F419 Anxiety disorder, unspecified: Secondary | ICD-10-CM

## 2022-07-06 DIAGNOSIS — I10 Essential (primary) hypertension: Secondary | ICD-10-CM

## 2022-10-15 ENCOUNTER — Encounter: Payer: Self-pay | Admitting: Emergency Medicine

## 2022-10-15 ENCOUNTER — Ambulatory Visit
Admission: EM | Admit: 2022-10-15 | Discharge: 2022-10-15 | Disposition: A | Discharge status: Home / Self Care | Payer: BC Managed Care – PPO | Attending: Physician Assistant | Admitting: Physician Assistant

## 2022-10-15 ENCOUNTER — Ambulatory Visit (INDEPENDENT_AMBULATORY_CARE_PROVIDER_SITE_OTHER): Payer: BC Managed Care – PPO

## 2022-10-15 DIAGNOSIS — M25462 Effusion, left knee: Secondary | ICD-10-CM

## 2022-10-15 DIAGNOSIS — M25562 Pain in left knee: Secondary | ICD-10-CM | POA: Diagnosis not present

## 2022-10-15 DIAGNOSIS — M7042 Prepatellar bursitis, left knee: Secondary | ICD-10-CM

## 2022-10-15 MED ORDER — DICLOFENAC SODIUM 75 MG PO TBEC: 75.0000 mg | DELAYED_RELEASE_TABLET | Freq: Two times a day (BID) | ORAL | 0 refills | Status: AC | PRN

## 2022-10-15 NOTE — ED Provider Notes (Addendum)
[facility]    CSN: [id number] Arrival date & time: 10/15/22  0809      History   Chief Complaint Chief Complaint  Patient presents with   Knee Pain    HPI Patrick Barajas. is a 57 y.o. male presenting for left knee pain and swelling for the past 2 days.  He denies any specific injuries.  He states he walks on concrete floors a lot at work and has to bend over and squat to pick things up.  Reports no pain at rest but increased pain when he bears weight and walks.  Increased pain with full extension of knee.   He has been using a knee brace for comfort.  He reports that the knee feels a little weak like it will give out at times.  Has taken ibuprofen infrequently.  Patient reports a history of similar symptoms which got better within a couple days of taking diclofenac sodium.  He also reports a history of gout but says it has never affected the larger joints.  Medical history significant for anxiety, GERD, hypertension.  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   Hyponatremia 10/01/2021   Abdominal pain 10/01/2021   Gout 06/03/2020   Tobacco use 09/01/2019   Anxiety 08/31/2019   GERD (gastroesophageal reflux disease) 08/31/2019   HTN (hypertension) 08/31/2019   Hypertriglyceridemia 08/31/2019    Past Surgical History:  Procedure Laterality Date   COLONOSCOPY WITH PROPOFOL N/A 10/20/2018   Procedure: COLONOSCOPY WITH PROPOFOL;  Surgeon: Christena Deem, MD;  Location: [facility] ENDOSCOPY;  Service: Endoscopy;  Laterality: N/A;   ESOPHAGOGASTRODUODENOSCOPY (EGD) WITH PROPOFOL N/A 10/20/2018   Procedure: ESOPHAGOGASTRODUODENOSCOPY (EGD) WITH PROPOFOL;  Surgeon: Christena Deem, MD;  Location: [facility] ENDOSCOPY;  Service: Endoscopy;   Laterality: N/A;       Home Medications    Prior to Admission medications   Medication Sig Start Date End Date Taking? Authorizing Provider  diclofenac (VOLTAREN) 75 MG EC tablet Take 1 tablet (75 mg total) by mouth 2 (two) times daily as needed for moderate pain. 10/15/22 11/14/22 Yes Eusebio Friendly B, PA-C  citalopram (CELEXA) 20 MG tablet TAKE 1 TABLET BY MOUTH EVERY DAY 07/06/22   Eden Emms, NP  lisinopril-hydrochlorothiazide (ZESTORETIC) 10-12.5 MG tablet TAKE 1 TABLET BY MOUTH EVERY DAY 07/06/22   Eden Emms, NP  Multiple Vitamin (MULTIVITAMIN) tablet Take 1 tablet by mouth daily.    [provider]  Omega-3 Fatty Acids (FISH OIL) 1000 MG CAPS Take by mouth. 2 daily    [provider]  omeprazole (PRILOSEC) 40 MG capsule TAKE 1 CAPSULE (40 MG TOTAL) BY MOUTH DAILY. 02/02/22   Eden Emms, NP    Family History Family History  Problem Relation Age of Onset   Breast cancer Mother    Stroke Father    Heart attack Father    Healthy Brother    Lung cancer Maternal Aunt    Liver cancer Maternal Aunt    Pancreatic cancer Maternal Uncle    Bone cancer Maternal Grandmother     Social History Social History   Tobacco Use   Smoking status: Never   Smokeless tobacco: Current    Types: Snuff  Vaping Use  Vaping Use: Never used  Substance Use Topics   Alcohol use: Not Currently    Comment: 10/01/2021 quit alcohol-was drinking about 20 12oz beer cans before then   Drug use: No     Allergies   Patient has no known allergies.   Review of Systems Review of Systems  Musculoskeletal:  Positive for arthralgias and joint swelling. Negative for gait problem.  Skin:  Negative for color change and wound.  Neurological:  Negative for weakness and numbness.     Physical Exam Triage Vital Signs ED Triage Vitals  Enc Vitals Group     BP      Pulse      Resp      Temp      Temp src      SpO2      Weight      Height      Head Circumference       Peak Flow      Pain Score      Pain Loc      Pain Edu?      Excl. in GC?    No data found.  Updated Vital Signs BP (!) 158/88 (BP Location: Left Arm)   Pulse 85   Temp 98.5 F (36.9 C) (Oral)   Resp 18   SpO2 100%   Physical Exam Vitals and nursing note reviewed.  Constitutional:      General: He is not in acute distress.    Appearance: Normal appearance. He is well-developed. He is not ill-appearing.  HENT:     Head: Normocephalic and atraumatic.  Eyes:     General: No scleral icterus.    Conjunctiva/sclera: Conjunctivae normal.  Cardiovascular:     Rate and Rhythm: Normal rate.     Pulses: Normal pulses.  Pulmonary:     Effort: Pulmonary effort is normal. No respiratory distress.  Musculoskeletal:     Cervical back: Neck supple.     Left knee: No swelling, erythema or ecchymosis. Normal range of motion. Tenderness present over the lateral joint line and patellar tendon.  Skin:    General: Skin is warm and dry.     Capillary Refill: Capillary refill takes less than 2 seconds.  Neurological:     General: No focal deficit present.     Mental Status: He is alert. Mental status is at baseline.     Motor: No weakness.     Gait: Gait abnormal.  Psychiatric:        Mood and Affect: Mood normal.        Behavior: Behavior normal.      UC Treatments / Results  Labs (all labs ordered are listed, but only abnormal results are displayed) Labs Reviewed - No data to display  EKG   Radiology DG Knee Complete 4 Views Left  Result Date: 10/15/2022 CLINICAL DATA:  Pain and swelling for 2 days. EXAM: LEFT KNEE - COMPLETE 4+ VIEW COMPARISON:  None Available. FINDINGS: No fracture. No subluxation or dislocation. Small suprapatellar joint effusion. Trace spurring noted patellofemoral compartment. IMPRESSION: Small suprapatellar joint effusion without acute bony findings. Electronically Signed   By: Kennith Center M.D.   On: 10/15/2022 09:11    Procedures Procedures (including  critical care time)  Medications Ordered in UC Medications - No data to display  Initial Impression / Assessment and Plan / UC Course  I have reviewed the triage vital signs and the nursing notes.  Pertinent labs & imaging results that were available  during my care of the patient were reviewed by me and considered in my medical decision making (see chart for details).   57 year old male presents for atraumatic left knee pain and swelling x 2 days.  X-ray of left knee obtained.  X-ray shows small suprapatellar joint effusion.  Reviewed results of x-ray with patient.  Likely prepatellar bursitis.  Reviewed RICE guidelines, diclofenac sodium (sent to pharmacy)/Tylenol for pain relief.  Continue to use knee brace for support.  Advised to follow-up with Ortho if no improvement over the next 1 to 2 weeks or if symptoms worsen.   Final Clinical Impressions(s) / UC Diagnoses   Final diagnoses:  Acute pain of left knee  Effusion of left knee  Prepatellar bursitis of left knee     Discharge Instructions      -You have bursitis. I sent diclofenac sodium NSAID to  pharmacy. May also take Tylenol and continue using the knee brace.  You have a condition requiring you to follow up with Orthopedics so please call one of the following office for appointment:   [facility] [address] [place], [place] [address] Phone: [phone]  [facility] [address] [place], [place] [address] Phone: [phone]   *There's a new [facility] in [place] as well.     ED Prescriptions     Medication Sig Dispense Auth. Provider   diclofenac (VOLTAREN) 75 MG EC tablet Take 1 tablet (75 mg total) by mouth 2 (two) times daily as needed for moderate pain. 60 tablet Gareth Morgan      PDMP not reviewed this encounter.   Shirlee Latch, PA-C 10/15/22 0924    Shirlee Latch, PA-C 10/15/22 [phone]

## 2022-10-15 NOTE — ED Triage Notes (Signed)
Pt presents with left knee pain and swelling x 2 days. He has been using a knee brace for comfort. Pt denies any injury, but he does walk a lot and bend at work.

## 2022-10-15 NOTE — Discharge Instructions (Addendum)
-  You have bursitis. I sent diclofenac sodium NSAID to  pharmacy. May also take Tylenol and continue using the knee brace.  You have a condition requiring you to follow up with Orthopedics so please call one of the following office for appointment:   [facility] [address] [place], [place] [address] Phone: [phone]  [facility] [address] [place], [place] [address] Phone: [phone]   *There's a new [facility] in [place] as well.

## 2023-01-04 ENCOUNTER — Other Ambulatory Visit: Payer: Self-pay | Admitting: Nurse Practitioner

## 2023-01-04 DIAGNOSIS — F419 Anxiety disorder, unspecified: Secondary | ICD-10-CM

## 2023-01-04 DIAGNOSIS — I10 Essential (primary) hypertension: Secondary | ICD-10-CM

## 2023-01-04 NOTE — Telephone Encounter (Signed)
Lvmtcb, sent mychart message  

## 2023-01-04 NOTE — Telephone Encounter (Signed)
Patients needs a CPE within the next 30 days. Any 20 min slot is ok for this patient. I have provided him with a 30 day supply of his medications

## 2023-01-29 ENCOUNTER — Other Ambulatory Visit: Payer: Self-pay | Admitting: Nurse Practitioner

## 2023-01-29 DIAGNOSIS — I10 Essential (primary) hypertension: Secondary | ICD-10-CM

## 2023-01-30 ENCOUNTER — Other Ambulatory Visit: Payer: Self-pay | Admitting: Nurse Practitioner

## 2023-01-30 DIAGNOSIS — F419 Anxiety disorder, unspecified: Secondary | ICD-10-CM

## 2023-01-31 NOTE — Telephone Encounter (Signed)
Lvm for patient tcb and schedule 

## 2023-01-31 NOTE — Telephone Encounter (Signed)
Patient needs an office visit to get refills on medication

## 2023-02-17 ENCOUNTER — Encounter: Payer: Self-pay | Admitting: Nurse Practitioner

## 2023-02-17 ENCOUNTER — Telehealth: Payer: Self-pay

## 2023-02-17 ENCOUNTER — Ambulatory Visit: Payer: BC Managed Care – PPO | Admitting: Nurse Practitioner

## 2023-02-17 VITALS — BP 160/110 | HR 84 | Temp 98.4°F | Ht 71.0 in | Wt 201.8 lb

## 2023-02-17 DIAGNOSIS — E781 Pure hyperglyceridemia: Secondary | ICD-10-CM

## 2023-02-17 DIAGNOSIS — F419 Anxiety disorder, unspecified: Secondary | ICD-10-CM

## 2023-02-17 DIAGNOSIS — Z0000 Encounter for general adult medical examination without abnormal findings: Secondary | ICD-10-CM

## 2023-02-17 DIAGNOSIS — M25562 Pain in left knee: Secondary | ICD-10-CM | POA: Diagnosis not present

## 2023-02-17 DIAGNOSIS — Z125 Encounter for screening for malignant neoplasm of prostate: Secondary | ICD-10-CM | POA: Diagnosis not present

## 2023-02-17 DIAGNOSIS — I10 Essential (primary) hypertension: Secondary | ICD-10-CM | POA: Diagnosis not present

## 2023-02-17 DIAGNOSIS — K219 Gastro-esophageal reflux disease without esophagitis: Secondary | ICD-10-CM

## 2023-02-17 DIAGNOSIS — Z0001 Encounter for general adult medical examination with abnormal findings: Secondary | ICD-10-CM | POA: Diagnosis not present

## 2023-02-17 LAB — LIPID PANEL
Cholesterol: 244 mg/dL — ABNORMAL HIGH (ref 0–200)
HDL: 47.8 mg/dL (ref >39.00)
Total CHOL/HDL Ratio: 5
Triglycerides: 507 mg/dL — ABNORMAL HIGH (ref 0.0–149.0)

## 2023-02-17 LAB — COMPREHENSIVE METABOLIC PANEL
ALT: 50 U/L (ref 0–53)
AST: 63 U/L — ABNORMAL HIGH (ref 0–37)
Albumin: 4.4 g/dL (ref 3.5–5.2)
Alkaline Phosphatase: 81 U/L (ref 39–117)
BUN: 5 mg/dL — ABNORMAL LOW (ref 6–23)
CO2: 26 meq/L (ref 19–32)
Calcium: 9.6 mg/dL (ref 8.4–10.5)
Chloride: 100 meq/L (ref 96–112)
Creatinine, Ser: 0.98 mg/dL (ref 0.40–1.50)
GFR: 86.99 mL/min (ref >60.00)
Glucose, Bld: 99 mg/dL (ref 70–99)
Potassium: 3.9 meq/L (ref 3.5–5.1)
Sodium: 137 meq/L (ref 135–145)
Total Bilirubin: 1 mg/dL (ref 0.2–1.2)
Total Protein: 7.6 g/dL (ref 6.0–8.3)

## 2023-02-17 LAB — CBC
HCT: 43.5 % (ref 39.0–52.0)
Hemoglobin: 14.7 g/dL (ref 13.0–17.0)
MCHC: 33.9 g/dL (ref 30.0–36.0)
MCV: 101.8 fL — ABNORMAL HIGH (ref 78.0–100.0)
Platelets: 374 10*3/uL (ref 150.0–400.0)
RBC: 4.27 Mil/uL (ref 4.22–5.81)
RDW: 12.7 % (ref 11.5–15.5)
WBC: 6.3 10*3/uL (ref 4.0–10.5)

## 2023-02-17 LAB — LDL CHOLESTEROL, DIRECT: Direct LDL: 108 mg/dL

## 2023-02-17 LAB — PSA: PSA: 1.05 ng/mL (ref 0.10–4.00)

## 2023-02-17 LAB — TSH: TSH: 2.55 u[IU]/mL (ref 0.35–5.50)

## 2023-02-17 LAB — HEMOGLOBIN A1C: Hgb A1c MFr Bld: 5.3 % (ref 4.6–6.5)

## 2023-02-17 MED ORDER — LISINOPRIL-HYDROCHLOROTHIAZIDE 10-12.5 MG PO TABS: 1.0000 | ORAL_TABLET | Freq: Every day | ORAL | 1 refills | Status: DC

## 2023-02-17 MED ORDER — OMEPRAZOLE 40 MG PO CPDR: 40.0000 mg | DELAYED_RELEASE_CAPSULE | Freq: Every day | ORAL | 1 refills | Status: AC

## 2023-02-17 NOTE — Assessment & Plan Note (Signed)
Currently on omeprazole 40 mg daily.  Stable continue medication as prescribed

## 2023-02-17 NOTE — Assessment & Plan Note (Signed)
Was on citalopram in the past.  Patient stopped medication states he does not feel like it was effective and is doing fine without it.  We will continue without the medication

## 2023-02-17 NOTE — Telephone Encounter (Signed)
Unable to reach pt by phone and left v/m for pt to call [phone]. Sending note to Audria Nine NP and [place] pool.

## 2023-02-17 NOTE — Patient Instructions (Signed)
Nice to see you today I will be in touch with the labs once I have reviewed them  Follow up with me in 3 months, sooner if you need me 

## 2023-02-17 NOTE — Assessment & Plan Note (Signed)
Discussed age-appropriate immunizations and screening exams.  Did review patient's personal, surgical, social, family histories.  Patient up-to-date on all age-appropriate vaccinations he would like.  Patient up-to-date on CRC screening.  PSA for prostate cancer screening today.  Patient was given information at discharge about preventative healthcare maintenance with anticipatory guidance.

## 2023-02-17 NOTE — Telephone Encounter (Signed)
Patient was seen at his appointment

## 2023-02-17 NOTE — Assessment & Plan Note (Signed)
History of the same.  Patient medication over a week.  Blood pressure not controlled refill provided patient to follow-up in 3 months to make sure blood pressure is back under control medication

## 2023-02-17 NOTE — Assessment & Plan Note (Signed)
No injury.  Exam benign.  Continue to over-the-counter analgesics continue 600 mg ibuprofen 3 times daily as needed courage patient to wear knee sleeve while at work for several days in a row.

## 2023-02-17 NOTE — Progress Notes (Signed)
Established Patient Office Visit  Subjective   Patient ID: Patrick Pacifico., male    DOB: [birth year]  Age: 57 y.o. MRN: [id number]  Chief Complaint  Patient presents with   Medication Refill    Lisinopril    Medication Management    Pt states that he no longer want to take citalopram. States that the medication does not work.      Patient did mention that he had some kind of cold with a fever for a few days. He did say he took covid test at home and it was negative. Offered to evaluate complaint in office but patient declined   HTN: been off the pill for over a week. Has been on meds before and tolerates it well. States that he does have a bp cuff and does not check it   GERD: he is not on omeprazole 40 . States that he does well with it.  Patient states when he comes off the medication he gets reflux quite easily.  Anxiety/depression: states that he has been off the citalopram for over a week and feels fine. No withdrawal symptoms per patient report.  States he does not like the medication needed   Left knee: states that it has been bothering him for the past few years. States that it has been intermittent. States worse when he is working his 5 day strethc. When he is off he will rest it. States that he will do arthritis cream and ibuprofen. That will help   for complete physical and follow up of chronic conditions.  Immunizations: -Tetanus: Completed in 2015 -Influenza: refused  -Shingles: discussed in office  -Pneumonia: Too young  Diet: Fair diet. 3-4 meals a day with some snacks. Fast food eater. Coffee ginger ale, Gatorade  Exercise: No regular exercise. Yard work and employment   Eye exam: needs Scientist, clinical (histocompatibility and immunogenetics). glasses  Dental exam: Needs updating. Has a top plate.  Has spoke with the nurse about getting his bottom teeth removed and the bottom plate  Colonoscopy: Completed in 10/20/2018, polyp removal  Lung Cancer Screening: na  PSA: Due      Review of Systems   Constitutional:  Positive for fever. Negative for chills.  Respiratory:  Negative for shortness of breath.   Cardiovascular:  Negative for chest pain and leg swelling.  Gastrointestinal:  Negative for abdominal pain, blood in stool, constipation, diarrhea, nausea and vomiting.       BM daily   Genitourinary:  Negative for dysuria and hematuria.  Musculoskeletal:  Positive for joint pain.  Neurological:  Negative for tingling and headaches.  Psychiatric/Behavioral:  Negative for hallucinations and suicidal ideas.       Objective:     BP (!) 160/110   Pulse 84   Temp 98.4 F (36.9 C) (Oral)   Ht 5\' 11"  (1.803 m)   Wt 201 lb 12.8 oz (91.5 kg)   SpO2 97%   BMI 28.15 kg/m  BP Readings from Last 3 Encounters:  02/17/23 (!) 160/110  10/15/22 (!) 158/88  12/17/21 108/72   Wt Readings from Last 3 Encounters:  02/17/23 201 lb 12.8 oz (91.5 kg)  12/17/21 189 lb (85.7 kg)  10/01/21 195 lb (88.5 kg)   SpO2 Readings from Last 3 Encounters:  02/17/23 97%  10/15/22 100%  12/17/21 97%      Physical Exam Vitals and nursing note reviewed.  Constitutional:      Appearance: Normal appearance.  HENT:     Right Ear: Tympanic membrane,  ear canal and external ear normal.     Left Ear: Tympanic membrane, ear canal and external ear normal.     Mouth/Throat:     Mouth: Mucous membranes are moist.     Pharynx: Oropharynx is clear.  Eyes:     Extraocular Movements: Extraocular movements intact.     Pupils: Pupils are equal, round, and reactive to light.  Cardiovascular:     Rate and Rhythm: Normal rate and regular rhythm.     Pulses: Normal pulses.     Heart sounds: Normal heart sounds.  Pulmonary:     Effort: Pulmonary effort is normal.     Breath sounds: Normal breath sounds.  Abdominal:     General: Bowel sounds are normal. There is no distension.     Palpations: There is no mass.     Tenderness: There is no abdominal tenderness.     Hernia: No hernia is present.   Musculoskeletal:        General: No tenderness.     Left knee: No bony tenderness or crepitus. Normal range of motion. No tenderness. Normal pulse.     Right lower leg: No edema.     Left lower leg: No edema.  Lymphadenopathy:     Cervical: No cervical adenopathy.  Skin:    General: Skin is warm.  Neurological:     General: No focal deficit present.     Mental Status: He is alert.     Deep Tendon Reflexes:     Reflex Scores:      Bicep reflexes are 2+ on the right side and 2+ on the left side.      Patellar reflexes are 2+ on the right side and 2+ on the left side.    Comments: Bilateral upper and lower extremity strength 5/5  Psychiatric:        Mood and Affect: Mood normal.        Behavior: Behavior normal.        Thought Content: Thought content normal.        Judgment: Judgment normal.      No results found for any visits on 02/17/23.    The 10-year ASCVD risk score (Arnett DK, et al., 2019) is: 13.6%    Assessment & Plan:   Problem List Items Addressed This Visit       Cardiovascular and Mediastinum   HTN (hypertension)    History of the same.  Patient medication over a week.  Blood pressure not controlled refill provided patient to follow-up in 3 months to make sure blood pressure is back under control medication      Relevant Medications   lisinopril-hydrochlorothiazide (ZESTORETIC) 10-12.5 MG tablet   Other Relevant Orders   Hemoglobin A1c     Digestive   GERD (gastroesophageal reflux disease)    Currently on omeprazole 40 mg daily.  Stable continue medication as prescribed      Relevant Medications   omeprazole (PRILOSEC) 40 MG capsule     Other   Anxiety    Was on citalopram in the past.  Patient stopped medication states he does not feel like it was effective and is doing fine without it.  We will continue without the medication      Hypertriglyceridemia    History of same.  Pending lipid panel today patient states he used to take fish oils  but no longer does that      Relevant Medications   lisinopril-hydrochlorothiazide (ZESTORETIC) 10-12.5 MG tablet  Other Relevant Orders   Hemoglobin A1c   Lipid panel   Preventative health care - Primary    Discussed age-appropriate immunizations and screening exams.  Did review patient's personal, surgical, social, family histories.  Patient up-to-date on all age-appropriate vaccinations he would like.  Patient up-to-date on CRC screening.  PSA for prostate cancer screening today.  Patient was given information at discharge about preventative healthcare maintenance with anticipatory guidance.      Relevant Orders   CBC   Comprehensive metabolic panel   TSH   Acute pain of left knee    No injury.  Exam benign.  Continue to over-the-counter analgesics continue 600 mg ibuprofen 3 times daily as needed courage patient to wear knee sleeve while at work for several days in a row.      Other Visit Diagnoses     Screening for prostate cancer       Relevant Orders   PSA       Return in about 3 months (around 05/20/2023) for BP recheck.    Audria Nine, NP

## 2023-02-17 NOTE — Assessment & Plan Note (Signed)
History of same.  Pending lipid panel today patient states he used to take fish oils but no longer does that

## 2023-03-08 NOTE — Progress Notes (Unsigned)
    Patrick Yagi T. Shayanna Thatch, MD, CAQ Sports Medicine [facility] [address] [place], [address]  Phone: [phone]  FAX: [phone]  Patrick Barajas. - 57 y.o. male  MRN [id number]  Date of Birth: 1965-11-15  Date: 03/09/2023  PCP: Eden Emms, NP  Referral: Eden Emms, NP  No chief complaint on file.  Subjective:   Patrick Gamarra. is a 57 y.o. very pleasant male patient with There is no height or weight on file to calculate BMI. who presents with the following:  Patient presents with ongoing knee pain.  Appointment notes right-sided knee pain, the patient has been seen recently for ongoing left-sided knee pain.  Joint spaces from June 2024 x-rays are reviewed and they are preserved.    Review of Systems is noted in the HPI, as appropriate  Objective:   There were no vitals taken for this visit.  GEN: No acute distress; alert,appropriate. PULM: Breathing comfortably in no respiratory distress PSYCH: Normally interactive.   Laboratory and Imaging Data:  Assessment and Plan:   ***

## 2023-03-09 ENCOUNTER — Encounter: Payer: Self-pay | Admitting: Family Medicine

## 2023-03-09 ENCOUNTER — Ambulatory Visit (INDEPENDENT_AMBULATORY_CARE_PROVIDER_SITE_OTHER): Payer: BC Managed Care – PPO | Admitting: Family Medicine

## 2023-03-09 VITALS — BP 140/86 | HR 76 | Temp 98.6°F | Ht 71.0 in | Wt 206.4 lb

## 2023-03-09 DIAGNOSIS — M25461 Effusion, right knee: Secondary | ICD-10-CM

## 2023-03-09 DIAGNOSIS — M25561 Pain in right knee: Secondary | ICD-10-CM

## 2023-03-09 MED ORDER — TRIAMCINOLONE ACETONIDE 40 MG/ML IJ SUSP
40.0000 mg | Freq: Once | INTRAMUSCULAR | Status: AC
  Administered 2023-03-09: 40 mg via INTRA_ARTICULAR

## 2023-03-09 MED ORDER — COLCHICINE 0.6 MG PO TABS: 0.6000 mg | ORAL_TABLET | Freq: Two times a day (BID) | ORAL | 2 refills | Status: DC

## 2023-03-09 MED ORDER — INDOMETHACIN 50 MG PO CAPS: 50.0000 mg | ORAL_CAPSULE | Freq: Three times a day (TID) | ORAL | 2 refills | Status: DC | PRN

## 2023-03-10 LAB — SYNOVIAL FLUID ANALYSIS, COMPLETE
Basophils, %: 0 %
Eosinophils-Synovial: 0 % (ref 0–2)
Lymphocytes-Synovial Fld: 27 % (ref 0–74)
Monocyte/Macrophage: 43 % (ref 0–69)
Neutrophil, Synovial: 30 % — ABNORMAL HIGH (ref 0–24)
Synoviocytes, %: 0 % (ref 0–15)
WBC, Synovial: 1212 {cells}/uL — ABNORMAL HIGH (ref <150)

## 2023-03-15 LAB — ANAEROBIC AND AEROBIC CULTURE
AER RESULT:: NO GROWTH
MICRO NUMBER:: 15725704
MICRO NUMBER:: 15725705
MICRO NUMBER:: 15725705
SPECIMEN QUALITY:: ADEQUATE
SPECIMEN QUALITY:: ADEQUATE

## 2023-05-24 ENCOUNTER — Ambulatory Visit: Payer: BC Managed Care – PPO | Admitting: Nurse Practitioner

## 2023-05-25 ENCOUNTER — Encounter: Payer: Self-pay | Admitting: Nurse Practitioner

## 2023-06-28 ENCOUNTER — Other Ambulatory Visit: Payer: Self-pay | Admitting: Nurse Practitioner

## 2023-06-28 DIAGNOSIS — I10 Essential (primary) hypertension: Secondary | ICD-10-CM

## 2023-06-28 DIAGNOSIS — F419 Anxiety disorder, unspecified: Secondary | ICD-10-CM

## 2023-06-28 NOTE — Telephone Encounter (Signed)
 Lvm for pt to call the office back.

## 2023-06-28 NOTE — Telephone Encounter (Signed)
 Can we get the patien scheduled for a blood pressure follow up within the next 30 days he is over due

## 2023-06-29 NOTE — Telephone Encounter (Signed)
 Lvmtcb, sent mychart message

## 2023-06-30 NOTE — Telephone Encounter (Signed)
 lvmtcb

## 2023-07-11 ENCOUNTER — Emergency Department

## 2023-07-11 ENCOUNTER — Emergency Department
Admission: EM | Admit: 2023-07-11 | Discharge: 2023-07-11 | Disposition: A | Discharge status: Home / Self Care | Attending: Emergency Medicine | Admitting: Emergency Medicine

## 2023-07-11 ENCOUNTER — Other Ambulatory Visit: Payer: Self-pay

## 2023-07-11 ENCOUNTER — Encounter: Payer: Self-pay | Admitting: Emergency Medicine

## 2023-07-11 DIAGNOSIS — I10 Essential (primary) hypertension: Secondary | ICD-10-CM | POA: Insufficient documentation

## 2023-07-11 DIAGNOSIS — K4020 Bilateral inguinal hernia, without obstruction or gangrene, not specified as recurrent: Secondary | ICD-10-CM | POA: Diagnosis not present

## 2023-07-11 DIAGNOSIS — R7401 Elevation of levels of liver transaminase levels: Secondary | ICD-10-CM | POA: Diagnosis not present

## 2023-07-11 DIAGNOSIS — R109 Unspecified abdominal pain: Secondary | ICD-10-CM | POA: Diagnosis not present

## 2023-07-11 DIAGNOSIS — R1012 Left upper quadrant pain: Secondary | ICD-10-CM | POA: Diagnosis not present

## 2023-07-11 LAB — CBC
Hemoglobin: 14.3 g/dL (ref 13.0–17.0)
Platelets: 309 10*3/uL (ref 150–400)
WBC: 7.9 10*3/uL (ref 4.0–10.5)

## 2023-07-11 LAB — COMPREHENSIVE METABOLIC PANEL
ALT: 67 U/L — ABNORMAL HIGH (ref 0–44)
AST: 74 U/L — ABNORMAL HIGH (ref 15–41)
Albumin: 4.3 g/dL (ref 3.5–5.0)
Alkaline Phosphatase: 53 U/L (ref 38–126)
Anion gap: 15 (ref 5–15)
BUN: 10 mg/dL (ref 6–20)
CO2: 24 mmol/L (ref 22–32)
Calcium: 9.5 mg/dL (ref 8.9–10.3)
Chloride: 93 mmol/L — ABNORMAL LOW (ref 98–111)
Creatinine, Ser: 0.98 mg/dL (ref 0.61–1.24)
GFR, Estimated: >60 mL/min (ref >60)
Glucose, Bld: 110 mg/dL — ABNORMAL HIGH (ref 70–99)
Potassium: 3.3 mmol/L — ABNORMAL LOW (ref 3.5–5.1)
Sodium: 132 mmol/L — ABNORMAL LOW (ref 135–145)
Total Bilirubin: 2.5 mg/dL — ABNORMAL HIGH (ref 0.0–1.2)
Total Protein: 7.6 g/dL (ref 6.5–8.1)

## 2023-07-11 LAB — URINALYSIS, ROUTINE W REFLEX MICROSCOPIC
Bilirubin Urine: NEGATIVE
Glucose, UA: NEGATIVE mg/dL
Hgb urine dipstick: NEGATIVE
Ketones, ur: NEGATIVE mg/dL
Leukocytes,Ua: NEGATIVE
Nitrite: NEGATIVE
Protein, ur: NEGATIVE mg/dL
Specific Gravity, Urine: 1.015 (ref 1.005–1.030)
pH: 5 (ref 5.0–8.0)

## 2023-07-11 LAB — LIPASE, BLOOD: Lipase: 39 U/L (ref 11–51)

## 2023-07-11 NOTE — ED Triage Notes (Signed)
 Pt in via POV, reports LUQ abdominal pain since Thursday.  Denies any N/V/D.  Does report hx of pancreatitis w/ daily ETOH; states he has actually been trying to quit over last 3 days.  Ambulatory to triage, NAD noted at this time.  Vital unremarkable.

## 2023-07-11 NOTE — ED Notes (Signed)
 See triage note  Presents with some LUQ pain  States he felt like it may be pancreatitis  Hx of same  No fever

## 2023-07-11 NOTE — ED Provider Notes (Signed)
 [facility] Provider Note    Event Date/Time   First MD Initiated Contact with Patient 07/11/23 1151     (approximate)  History   Chief Complaint: Abdominal Pain  HPI  Patrick Barajas. is a 58 y.o. male with a past medical history of anxiety, gastric reflux, hypertension, presents to the emergency department for left-sided abdominal pain.  According to the patient for the last 2 to 3 days he has been experiencing intermittent pain in the left flank.  Does have a history of pancreatitis and does state daily alcohol use.  Patient denies any dysuria or hematuria.  Denies any vomiting or diarrhea.  No fever.  Physical Exam   Triage Vital Signs: ED Triage Vitals  Encounter Vitals Group     BP 07/11/23 1051 (!) 154/106     Systolic BP Percentile --      Diastolic BP Percentile --      Pulse Rate 07/11/23 1051 90     Resp 07/11/23 1051 16     Temp 07/11/23 1051 98.5 F (36.9 C)     Temp Source 07/11/23 1051 Oral     SpO2 07/11/23 1051 97 %     Weight 07/11/23 1053 205 lb (93 kg)     Height 07/11/23 1053 5\' 11"  (1.803 m)     Head Circumference --      Peak Flow --      Pain Score 07/11/23 1052 4     Pain Loc --      Pain Education --      Exclude from Growth Chart --     Most recent vital signs: Vitals:   07/11/23 1051  BP: (!) 154/106  Pulse: 90  Resp: 16  Temp: 98.5 F (36.9 C)  SpO2: 97%    General: Awake, no distress.  CV:  Good peripheral perfusion.  Regular rate and rhythm  Resp:  Normal effort.  Equal breath sounds bilaterally.  Abd:  No distention.  Soft, nontender abdomen.  Benign.  ED Results / Procedures / Treatments   EKG  I have reviewed and interpreted CT images.  No kidney stone or significant abnormality seen on my evaluation. Radiology has read the CT scan as negative for acute process within the abdomen.  RADIOLOGY  I have reviewed the CT images.  I do not appreciate any kidney stone on my  evaluation.   MEDICATIONS ORDERED IN ED: Medications - No data to display   IMPRESSION / MDM / ASSESSMENT AND PLAN / ED COURSE  I reviewed the triage vital signs and the nursing notes.  Patient's presentation is most consistent with acute presentation with potential threat to life or bodily function.  Patient presents emergency department for left flank pain intermittent over the last 2 to 3 days.  Overall the patient appears well, benign abdomen.  Patient denies any urinary symptoms.  No fever.  Patient's lab work has resulted showing a normal white blood cell count, urinalysis shows no blood or signs of infection.  Chemistry shows mild LFT elevation which has been present previously as well highly suspect due to chronic alcohol use.  No right upper quadrant tenderness.  Lipase is normal.  Will obtain a CT scan of the abdomen/pelvis to further evaluate.  Patient agreeable to plan.  Patient's workup is reassuring.  CT scan has resulted negative, urinalysis is normal.  Given the patient's reassuring workup we will discharge home with outpatient follow-up.  Patient is agreeable to this plan  of care.  FINAL CLINICAL IMPRESSION(S) / ED DIAGNOSES   Left flank pain   Note:  This document was prepared using Dragon voice recognition software and may include unintentional dictation errors.   Minna Antis, MD 07/11/23 1416

## 2023-08-12 ENCOUNTER — Other Ambulatory Visit: Payer: Self-pay | Admitting: Nurse Practitioner

## 2023-08-12 DIAGNOSIS — F419 Anxiety disorder, unspecified: Secondary | ICD-10-CM

## 2023-09-02 ENCOUNTER — Ambulatory Visit: Payer: Self-pay

## 2023-09-02 ENCOUNTER — Encounter: Payer: Self-pay | Admitting: Nurse Practitioner

## 2023-09-02 ENCOUNTER — Ambulatory Visit (INDEPENDENT_AMBULATORY_CARE_PROVIDER_SITE_OTHER): Admitting: Nurse Practitioner

## 2023-09-02 VITALS — BP 136/70 | HR 82 | Temp 98.3°F | Ht 71.0 in | Wt 200.0 lb

## 2023-09-02 DIAGNOSIS — G4489 Other headache syndrome: Secondary | ICD-10-CM | POA: Insufficient documentation

## 2023-09-02 DIAGNOSIS — R454 Irritability and anger: Secondary | ICD-10-CM | POA: Insufficient documentation

## 2023-09-02 DIAGNOSIS — G44209 Tension-type headache, unspecified, not intractable: Secondary | ICD-10-CM | POA: Insufficient documentation

## 2023-09-02 DIAGNOSIS — R7989 Other specified abnormal findings of blood chemistry: Secondary | ICD-10-CM | POA: Insufficient documentation

## 2023-09-02 DIAGNOSIS — H9202 Otalgia, left ear: Secondary | ICD-10-CM | POA: Insufficient documentation

## 2023-09-02 DIAGNOSIS — H6992 Unspecified Eustachian tube disorder, left ear: Secondary | ICD-10-CM | POA: Insufficient documentation

## 2023-09-02 LAB — HEPATIC FUNCTION PANEL
ALT: 32 U/L (ref 0–53)
AST: 29 U/L (ref 0–37)
Albumin: 4.6 g/dL (ref 3.5–5.2)
Alkaline Phosphatase: 66 U/L (ref 39–117)
Bilirubin, Direct: 0.2 mg/dL (ref 0.0–0.3)
Total Bilirubin: 1.6 mg/dL — ABNORMAL HIGH (ref 0.2–1.2)
Total Protein: 7.7 g/dL (ref 6.0–8.3)

## 2023-09-02 LAB — POC COVID19 BINAXNOW: SARS Coronavirus 2 Ag: NEGATIVE

## 2023-09-02 MED ORDER — FLUTICASONE PROPIONATE 50 MCG/ACT NA SUSP: 2.0000 | Freq: Every day | NASAL | 0 refills | Status: DC

## 2023-09-02 MED ORDER — SERTRALINE HCL 50 MG PO TABS: ORAL_TABLET | ORAL | 0 refills | Status: DC

## 2023-09-02 NOTE — Assessment & Plan Note (Signed)
 Patient states he has been more irritable and short since he stopped drinking alcohol.  Will start patient on sertraline 25 mg daily for 2 weeks and then titrate to 50 mg daily thereafter patient denies HI/SI/AVH.  Did go over the host of side effects that are possible with his medication inclusive of the ED, delayed ejaculation, weight gain, SI/HI.

## 2023-09-02 NOTE — Telephone Encounter (Signed)
 Noted patient was evaluated in office

## 2023-09-02 NOTE — Progress Notes (Signed)
 Acute Office Visit  Subjective:     Patient ID: Patrick Barajas., male    DOB: [birth year], 58 y.o.   MRN: [id number]  Chief Complaint  Patient presents with   Ear Pain    C/o L ear pain. Started last night. Also, feels pain in ear when swallowing. Also, pt states he's 31 days alcohol-free and is having trouble with sleep and sugar cravings.     HPI Patient is in today for ear pain with a history of HTN, GERDm tobacco use, hld, gout  States that he is having left ear pain. He did have headache that went away in the ibuprofen .  States that when he is drinking stuff he feel pop Girl friend is sick but has not been evaluated yet.  States that yesterday was 31 days sober. Since then he has been more irritabile and short. States that he has been using the PM medications to help him sleep. He is unsure if he needs it to sleep or if it is just the idea that he needs it. He is wanting to try and sleep without intervention    Review of Systems  Constitutional:  Positive for malaise/fatigue. Negative for chills and fever.  HENT:  Positive for ear pain. Negative for ear discharge, sinus pain and sore throat.   Respiratory:  Negative for cough and shortness of breath.   Cardiovascular:  Negative for chest pain.  Gastrointestinal:  Negative for diarrhea.  Musculoskeletal:  Negative for joint pain and myalgias.  Neurological:  Positive for headaches. Negative for dizziness.        Objective:    BP 136/70   Pulse 82   Temp 98.3 F (36.8 C) (Oral)   Ht 5\' 11"  (1.803 m)   Wt 200 lb (90.7 kg)   SpO2 97%   BMI 27.89 kg/m  BP Readings from Last 3 Encounters:  09/02/23 136/70  07/11/23 (!) 148/90  03/09/23 (!) 140/86   Wt Readings from Last 3 Encounters:  09/02/23 200 lb (90.7 kg)  07/11/23 205 lb (93 kg)  03/09/23 206 lb 6 oz (93.6 kg)   SpO2 Readings from Last 3 Encounters:  09/02/23 97%  07/11/23 98%  03/09/23 98%      Physical Exam Vitals and nursing note reviewed.   Constitutional:      Appearance: Normal appearance.  HENT:     Right Ear: Tympanic membrane, ear canal and external ear normal.     Left Ear: Tympanic membrane, ear canal and external ear normal.     Mouth/Throat:     Mouth: Mucous membranes are moist.     Pharynx: Oropharynx is clear.     Comments: No clicking or popping with palpation at TMJ while patient opened and closed jaw Cardiovascular:     Rate and Rhythm: Normal rate and regular rhythm.     Heart sounds: Normal heart sounds.  Pulmonary:     Effort: Pulmonary effort is normal.     Breath sounds: Normal breath sounds.  Lymphadenopathy:     Cervical: No cervical adenopathy.  Neurological:     Mental Status: He is alert.     Results for orders placed or performed in visit on 09/02/23  POC COVID-19 BinaxNow  Result Value Ref Range   SARS Coronavirus 2 Ag Negative Negative        Assessment & Plan:   Problem List Items Addressed This Visit       Nervous and Auditory   Dysfunction of left  eustachian tube   Flonase nasal spray 2 sprays nostril daily 50 mcg per actuation      Relevant Medications   fluticasone (FLONASE) 50 MCG/ACT nasal spray     Other   Left ear pain - Primary   COVID test in office.  Patient can use over-the-counter analgesics as needed Flonase offer atelectasis secondary to ETD      Relevant Medications   fluticasone (FLONASE) 50 MCG/ACT nasal spray   Other Relevant Orders   POC COVID-19 BinaxNow (Completed)   Other headache syndrome   Relevant Medications   sertraline (ZOLOFT) 50 MG tablet   Irritability   Patient states he has been more irritable and short since he stopped drinking alcohol.  Will start patient on sertraline 25 mg daily for 2 weeks and then titrate to 50 mg daily thereafter patient denies HI/SI/AVH.  Did go over the host of side effects that are possible with his medication inclusive of the ED, delayed ejaculation, weight gain, SI/HI.      Relevant Medications    sertraline (ZOLOFT) 50 MG tablet   Elevated LFTs   History of the same likely secondary to EtOH abuse.  Patient has been sober for 31 days will recheck LFTs to see if they are trending down.  Patient congratulated and encouraged to continue sobriety      Relevant Orders   Hepatic function panel    Meds ordered this encounter  Medications   fluticasone (FLONASE) 50 MCG/ACT nasal spray    Sig: Place 2 sprays into both nostrils daily.    Dispense:  16 g    Refill:  0    Supervising Provider:   Deri Fleet A [[id number]]   sertraline (ZOLOFT) 50 MG tablet    Sig: Take 0.5 tablets (25 mg total) by mouth daily for 14 days, THEN 1 tablet (50 mg total) daily for 16 days.    Dispense:  23 tablet    Refill:  0    Supervising Provider:   Deri Fleet A [[id number]]    Return in about 6 weeks (around 10/14/2023) for Irritablity .  Margarie Shay, NP

## 2023-09-02 NOTE — Assessment & Plan Note (Signed)
 Flonase nasal spray 2 sprays nostril daily 50 mcg per actuation

## 2023-09-02 NOTE — Assessment & Plan Note (Signed)
 History of the same likely secondary to EtOH abuse.  Patient has been sober for 31 days will recheck LFTs to see if they are trending down.  Patient congratulated and encouraged to continue sobriety

## 2023-09-02 NOTE — Assessment & Plan Note (Signed)
 COVID test in office.  Patient can use over-the-counter analgesics as needed Flonase offer atelectasis secondary to ETD

## 2023-09-02 NOTE — Patient Instructions (Signed)
 Nice to see you today Covid test was negative  Follow up with me in 6-8 weeks to see how the zoloft is doing Sooner if you need me

## 2023-09-02 NOTE — Telephone Encounter (Signed)
 Copied from CRM [id number]. Topic: Clinical - Red Word Triage >> Sep 02, 2023  9:12 AM Patrick Barajas wrote: [facility] that prompted transfer to Nurse Triage: Patient has a ear ache he stated when he drink something it hurts and it's getting worse.   Chief Complaint: ear pain Symptoms: sharp pain Frequency: constant Pertinent Negatives: Patient denies URI symptoms Disposition: [] ED /[] Urgent Care (no appt availability in office) / [x] Appointment(In office/virtual)/ []  [place] [facility]/ [] Home Care/ [] Refused Recommended Disposition /[] Minor Mobile Bus/ []  Follow-up with PCP Additional Notes: Appt for today at 0940  Reason for Disposition  Earache  (Exceptions: brief ear pain of < 60 minutes duration, earache occurring during air travel  Answer Assessment - Initial Assessment Questions 1. LOCATION: "Which ear is involved?"     Left ear  2. ONSET: "When did the ear start hurting"      Started hurting last night  3. SEVERITY: "How bad is the pain?"  (Scale 1-10; mild, moderate or severe)   - MILD (1-3): doesn't interfere with normal activities    - MODERATE (4-7): interferes with normal activities or awakens from sleep    - SEVERE (8-10): excruciating pain, unable to do any normal activities      3-4/10  4. URI SYMPTOMS: "Do you have a runny nose or cough?"     No  5. FEVER: "Do you have a fever?" If Yes, ask: "What is your temperature, how was it measured, and when did it start?"     No  6. CAUSE: "Have you been swimming recently?", "How often do you use Q-TIPS?", "Have you had any recent air travel or scuba diving?"     No  7. OTHER SYMPTOMS: "Do you have any other symptoms?" (e.g., headache, stiff neck, dizziness, vomiting, runny nose, decreased hearing)     Headache  Protocols used: Earache-A-AH

## 2023-09-06 ENCOUNTER — Ambulatory Visit: Payer: Self-pay | Admitting: Nurse Practitioner

## 2023-10-07 ENCOUNTER — Other Ambulatory Visit: Payer: Self-pay | Admitting: Family Medicine

## 2023-10-07 ENCOUNTER — Other Ambulatory Visit: Payer: Self-pay | Admitting: Nurse Practitioner

## 2023-10-07 DIAGNOSIS — F419 Anxiety disorder, unspecified: Secondary | ICD-10-CM

## 2023-10-07 DIAGNOSIS — I10 Essential (primary) hypertension: Secondary | ICD-10-CM

## 2023-12-07 ENCOUNTER — Encounter: Payer: Self-pay | Admitting: Emergency Medicine

## 2023-12-07 ENCOUNTER — Emergency Department
Admission: EM | Admit: 2023-12-07 | Discharge: 2023-12-07 | Disposition: A | Discharge status: Home / Self Care | Source: Ambulatory Visit | Attending: Emergency Medicine | Admitting: Emergency Medicine

## 2023-12-07 ENCOUNTER — Other Ambulatory Visit: Payer: Self-pay

## 2023-12-07 ENCOUNTER — Emergency Department

## 2023-12-07 DIAGNOSIS — R109 Unspecified abdominal pain: Secondary | ICD-10-CM | POA: Diagnosis not present

## 2023-12-07 DIAGNOSIS — F419 Anxiety disorder, unspecified: Secondary | ICD-10-CM | POA: Insufficient documentation

## 2023-12-07 DIAGNOSIS — R519 Headache, unspecified: Secondary | ICD-10-CM | POA: Insufficient documentation

## 2023-12-07 DIAGNOSIS — I10 Essential (primary) hypertension: Secondary | ICD-10-CM | POA: Diagnosis not present

## 2023-12-07 LAB — URINALYSIS, ROUTINE W REFLEX MICROSCOPIC
Bilirubin Urine: NEGATIVE
Glucose, UA: NEGATIVE mg/dL
Hgb urine dipstick: NEGATIVE
Ketones, ur: NEGATIVE mg/dL
Leukocytes,Ua: NEGATIVE
Nitrite: NEGATIVE
Protein, ur: NEGATIVE mg/dL
Specific Gravity, Urine: 1.013 (ref 1.005–1.030)
pH: 6 (ref 5.0–8.0)

## 2023-12-07 LAB — COMPREHENSIVE METABOLIC PANEL WITH GFR
ALT: 21 U/L (ref 0–44)
AST: 29 U/L (ref 15–41)
Albumin: 4.6 g/dL (ref 3.5–5.0)
Alkaline Phosphatase: 58 U/L (ref 38–126)
Anion gap: 12 (ref 5–15)
BUN: 10 mg/dL (ref 6–20)
CO2: 28 mmol/L (ref 22–32)
Calcium: 10.1 mg/dL (ref 8.9–10.3)
Chloride: 96 mmol/L — ABNORMAL LOW (ref 98–111)
Creatinine, Ser: 1.05 mg/dL (ref 0.61–1.24)
GFR, Estimated: >60 mL/min (ref >60)
Glucose, Bld: 118 mg/dL — ABNORMAL HIGH (ref 70–99)
Potassium: 4.1 mmol/L (ref 3.5–5.1)
Sodium: 136 mmol/L (ref 135–145)
Total Bilirubin: 2.9 mg/dL — ABNORMAL HIGH (ref 0.0–1.2)
Total Protein: 8.3 g/dL — ABNORMAL HIGH (ref 6.5–8.1)

## 2023-12-07 LAB — CBC
HCT: 43.8 % (ref 39.0–52.0)
Hemoglobin: 15.6 g/dL (ref 13.0–17.0)
MCH: 32.4 pg (ref 26.0–34.0)
MCHC: 35.6 g/dL (ref 30.0–36.0)
MCV: 90.9 fL (ref 80.0–100.0)
Platelets: 381 K/uL (ref 150–400)
RBC: 4.82 MIL/uL (ref 4.22–5.81)
RDW: 12 % (ref 11.5–15.5)
WBC: 7.3 K/uL (ref 4.0–10.5)
nRBC: 0 % (ref 0.0–0.2)

## 2023-12-07 LAB — TROPONIN I (HIGH SENSITIVITY): Troponin I (High Sensitivity): 4 ng/L (ref <18)

## 2023-12-07 LAB — LIPASE, BLOOD: Lipase: 44 U/L (ref 11–51)

## 2023-12-07 MED ORDER — ACETAMINOPHEN 500 MG PO TABS
1000.0000 mg | ORAL_TABLET | Freq: Once | ORAL | Status: AC
  Administered 2023-12-07 (×2): 1000 mg via ORAL
  Filled 2023-12-07: qty 2

## 2023-12-07 NOTE — Group Note (Deleted)
 Date:  12/07/2023 Time:  2:22 PM  Group Topic/Focus:  Wellness Toolbox:   The focus of this group is to discuss various aspects of wellness, balancing those aspects and exploring ways to increase the ability to experience wellness.  Patients will create a wellness toolbox for use upon discharge.     Participation Level:  {BHH PARTICIPATION OZCZO:77735}  Participation Quality:  {BHH PARTICIPATION QUALITY:22265}  Affect:  {BHH AFFECT:22266}  Cognitive:  {BHH COGNITIVE:22267}  Insight: {BHH Insight2:20797}  Engagement in Group:  {BHH ENGAGEMENT IN HMNLE:77731}  Modes of Intervention:  {BHH MODES OF INTERVENTION:22269}  Additional Comments:  ***  Myra Curtistine BROCKS 12/07/2023, 2:22 PM

## 2023-12-07 NOTE — ED Triage Notes (Signed)
 Patient states he feels like he is having a panic attack.  AAOx3. Skin warm and dry. NAD. Anxious.

## 2023-12-07 NOTE — ED Triage Notes (Signed)
 First Nurse Note;  Pt via POV from [facility]. Pt c/o headache and fatigue for the past 3 days and LUQ pain. Non-tender pain. Pt is A&Ox4 and NAD   KC reports:  161/92 BP  91 HR  99% on RA

## 2023-12-07 NOTE — ED Provider Notes (Addendum)
 [facility] Provider Note    Event Date/Time   First MD Initiated Contact with Patient 12/07/23 1203     (approximate)   History   Abdominal Pain   HPI  Patrick Micheals. is a 58 y.o. male past medical history significant for prior alcohol use, anxiety, gastric reflux, hypertension, who presents to the emergency department for left-sided abdominal pain and intermittent headaches.  Patient states that he has had a long history of left-sided abdominal pain that has been intermittent.  States that he had a prior history of significant alcohol use and it was always from his alcohol.  States that he stopped drinking on April 9.  Intermittent episodes of left-sided abdominal pain.  Denies any abdominal pain at this time.  Denies any association with nausea, vomiting, diaphoresis, diarrhea.  Denies any urinary symptoms, denies any hematuria.  No history of kidney stone.  States that he has been drinking a lot of nonalcoholic beer and thought that this might have caused his abdominal pain.  Also endorses intermittent episodes of headaches which is new for him.  Denies any change in vision, slurring of speech or trouble with ambulation.  Denies any gait instability.  Denies any recent falls or head trauma.  States that he has been taking intermittent ibuprofen  and Tylenol .  Denies any drug use.  Denies a severe headache.  States that he just felt off earlier today but states that he did take tramadol from a couple of years ago.  Feels like he is having an anxiety attack which has happened in the past but states that that was always with alcohol use.  Called and scheduled an appointment with his primary care physician tomorrow.     Physical Exam   Triage Vital Signs: ED Triage Vitals  Encounter Vitals Group     BP 12/07/23 1006 (!) 141/100     Girls Systolic BP Percentile --      Girls Diastolic BP Percentile --      Boys Systolic BP Percentile --      Boys Diastolic BP  Percentile --      Pulse Rate 12/07/23 1006 98     Resp 12/07/23 1006 16     Temp 12/07/23 1006 97.8 F (36.6 C)     Temp Source 12/07/23 1006 Oral     SpO2 12/07/23 1006 98 %     Weight 12/07/23 1006 199 lb 15.3 oz (90.7 kg)     Height --      Head Circumference --      Peak Flow --      Pain Score 12/07/23 1005 2     Pain Loc --      Pain Education --      Exclude from Growth Chart --     Most recent vital signs: Vitals:   12/07/23 1438 12/07/23 1439  BP: (!) 141/90   Pulse: 65 65  Resp: 15   Temp:    SpO2: 96% 96%    Physical Exam Constitutional:      Appearance: He is well-developed.  HENT:     Head: Atraumatic.  Eyes:     Conjunctiva/sclera: Conjunctivae normal.  Cardiovascular:     Rate and Rhythm: Regular rhythm.  Pulmonary:     Effort: No respiratory distress.  Abdominal:     General: Abdomen is flat.     Tenderness: There is no abdominal tenderness.  Musculoskeletal:     Cervical back: Normal range of motion.  Skin:    General: Skin is warm.     Capillary Refill: Capillary refill takes less than 2 seconds.  Neurological:     General: No focal deficit present.     Mental Status: He is alert. Mental status is at baseline.  Psychiatric:        Mood and Affect: Mood is anxious.     IMPRESSION / MDM / ASSESSMENT AND PLAN / ED COURSE  I reviewed the triage vital signs and the nursing notes.  Differential diagnosis including rebound headache, malignancy, anxiety/stress, pancreatitis, kidney stone, pyelonephritis, gastritis/PUD, no active pain at this time have a very low suspicion for ACS but will do 1 screening troponin    RADIOLOGY I independently reviewed imaging, my interpretation of imaging: CT scan of the head -no acute findings  LABS (all labs ordered are listed, but only abnormal results are displayed) Labs interpreted as -    Labs Reviewed  COMPREHENSIVE METABOLIC PANEL WITH GFR - Abnormal; Notable for the following components:       Result Value   Chloride 96 (*)    Glucose, Bld 118 (*)    Total Protein 8.3 (*)    Total Bilirubin 2.9 (*)    All other components within normal limits  URINALYSIS, ROUTINE W REFLEX MICROSCOPIC - Abnormal; Notable for the following components:   Color, Urine YELLOW (*)    APPearance CLEAR (*)    All other components within normal limits  LIPASE, BLOOD  CBC  TROPONIN I (HIGH SENSITIVITY)     MDM  Patient's lab work overall reassuring with no significant leukocytosis.  Creatinine appears to be at baseline with no significant electrolyte abnormality.  No signs of blood in his urine and have low suspicion for kidney stone.  No signs of a urinary tract infection, clinical picture is not consistent with pyelonephritis.  Given his new headache will add on a CT scan of his head for further evaluation.  Mild headache, most likely rebound headache.  Has normal neurologic exam.  Do not feel that CT scan of his abdomen and pelvis is necessary at this time given that he does not have any active pain and appears chronic in nature.  Colonoscopy and endoscopy done within the past 4 years.  Discussed close follow-up with his primary care physician which she has scheduled for tomorrow.  Discussed omeprazole .  Discussed return precautions for any ongoing or worsening symptoms.  No questions at time of discharge.     PROCEDURES:  Critical Care performed: No  Procedures  Patient's presentation is most consistent with acute presentation with potential threat to life or bodily function.   MEDICATIONS ORDERED IN ED: Medications  acetaminophen  (TYLENOL ) tablet 1,000 mg (1,000 mg Oral Given 12/07/23 1300)    FINAL CLINICAL IMPRESSION(S) / ED DIAGNOSES   Final diagnoses:  Acute nonintractable headache, unspecified headache type     Rx / DC Orders   ED Discharge Orders     None        Note:  This document was prepared using Dragon voice recognition software and may include unintentional  dictation errors.   Suzanne Kirsch, MD 12/07/23 1426    Suzanne Kirsch, MD 12/10/23 [phone]

## 2023-12-07 NOTE — Discharge Instructions (Signed)
 You were seen in the emergency department for not feeling well.  Your lab work was overall at your normal.  You had a CT scan of your head that did not show any abnormalities.  Follow-up with your primary care physician tomorrow and return to the emergency department if you have any ongoing or worsening symptoms or any new symptoms that are concerning.

## 2023-12-08 ENCOUNTER — Ambulatory Visit (INDEPENDENT_AMBULATORY_CARE_PROVIDER_SITE_OTHER): Admitting: Nurse Practitioner

## 2023-12-08 VITALS — BP 114/68 | HR 62 | Temp 97.7°F | Ht 71.0 in | Wt 196.0 lb

## 2023-12-08 DIAGNOSIS — G44209 Tension-type headache, unspecified, not intractable: Secondary | ICD-10-CM

## 2023-12-08 MED ORDER — CYCLOBENZAPRINE HCL 5 MG PO TABS: 5.0000 mg | ORAL_TABLET | Freq: Two times a day (BID) | ORAL | 0 refills | Status: DC | PRN

## 2023-12-08 NOTE — Patient Instructions (Signed)
 Nice to see you today I have sent in some muscle relaxer to the pharmacy. Use caution as they can make you sleepy Follow up with me in 3 months for your physical, sooner if you need me

## 2023-12-08 NOTE — Progress Notes (Signed)
 Acute Office Visit  Subjective:     Patient ID: Patrick Barajas., male    DOB: December 11, 1965, 58 y.o.   MRN: [id number]  Chief Complaint  Patient presents with   Headache    Pt complains of headaches that started Saturday. dull Pain is on and off.      Patient is in today for headaches with a history of  HTN, gout, GERD, hypertriglyceridemia  Patient was seen in the ED on 12/07/2023. They did lab works and CT of his head. CT of the head was negative. Lab work was grossly unremarkable. He is here today for follow up   States that it started in the morning of Saturday. States that over the past couple weeks he has changed and drinking up to 6 NA a day and several bars of cholocate. State that it is intermittent an din the front of the head atht ais dull and feel s like a tihgt tobage. Stats that he does take over the counter meds and it helps. States that it is a 3 on the scale.   States that he does 2 coffees and a soda at work. He would 2 gatorades at home then 6-7 NA beers   Review of Systems  Constitutional:  Negative for chills and fever.  Eyes:  Negative for blurred vision.  Respiratory:  Negative for shortness of breath.   Cardiovascular:  Negative for chest pain.  Neurological:  Positive for headaches. Negative for dizziness, tingling and weakness.  Psychiatric/Behavioral:  Negative for hallucinations and suicidal ideas.         Objective:    BP 114/68   Pulse 62   Temp 97.7 F (36.5 C) (Oral)   Ht 5' 11 (1.803 m)   Wt 196 lb (88.9 kg)   SpO2 95%   BMI 27.34 kg/m    Physical Exam Vitals and nursing note reviewed.  Constitutional:      Appearance: Normal appearance.  HENT:     Mouth/Throat:     Mouth: Mucous membranes are moist.     Pharynx: Oropharynx is clear.  Eyes:     Extraocular Movements: Extraocular movements intact.     Pupils: Pupils are equal, round, and reactive to light.  Cardiovascular:     Rate and Rhythm: Normal rate and regular rhythm.      Heart sounds: Normal heart sounds.  Pulmonary:     Effort: Pulmonary effort is normal.     Breath sounds: Normal breath sounds.  Musculoskeletal:       Arms:     Cervical back: No tenderness or bony tenderness.  Neurological:     General: No focal deficit present.     Mental Status: He is alert.     Deep Tendon Reflexes:     Reflex Scores:      Bicep reflexes are 2+ on the right side and 2+ on the left side.      Patellar reflexes are 2+ on the right side and 2+ on the left side.    Comments: Bilateral upper and lower extremity strength 5/5     No results found for any visits on 12/08/23.      Assessment & Plan:   Problem List Items Addressed This Visit       Other   Tension headache - Primary   Will treat cyclobenzaprine  5 mg BID prn. Sedation precautions reveiwed. Follow up if no improvement Neuro exam benign in office       Relevant  Medications   cyclobenzaprine  (FLEXERIL ) 5 MG tablet    Meds ordered this encounter  Medications   cyclobenzaprine  (FLEXERIL ) 5 MG tablet    Sig: Take 1 tablet (5 mg total) by mouth 2 (two) times daily as needed.    Dispense:  20 tablet    Refill:  0    Supervising Provider:   RANDEEN HARDY A [[id number]]    Return in about 3 months (around 03/09/2024) for CPE and Labs.  Adina Crandall, NP

## 2023-12-08 NOTE — Assessment & Plan Note (Signed)
 Will treat cyclobenzaprine  5 mg BID prn. Sedation precautions reveiwed. Follow up if no improvement Neuro exam benign in office

## 2024-01-08 ENCOUNTER — Other Ambulatory Visit: Payer: Self-pay | Admitting: Nurse Practitioner

## 2024-01-08 ENCOUNTER — Other Ambulatory Visit: Payer: Self-pay | Admitting: Family Medicine

## 2024-01-08 DIAGNOSIS — R454 Irritability and anger: Secondary | ICD-10-CM

## 2024-01-08 DIAGNOSIS — I10 Essential (primary) hypertension: Secondary | ICD-10-CM

## 2024-01-09 ENCOUNTER — Other Ambulatory Visit: Payer: Self-pay | Admitting: Nurse Practitioner

## 2024-01-09 NOTE — Telephone Encounter (Signed)
 Last office visit 12/08/23 with Adina Crandall for headache. Last refill 03/09/23 #50 w/ 2 refills.  Next office visit 03/13/24 3 mo f/u

## 2024-01-09 NOTE — Telephone Encounter (Unsigned)
 Copied from CRM #[id number]. Topic: Clinical - Medication Refill >> Jan 09, 2024  8:54 AM [place] M wrote: Medication: indomethacin  (INDOCIN ) 50 MG capsule ;  colchicine  0.6 MG tablet   Has the patient contacted their pharmacy? Yes (Agent: If no, request that the patient contact the pharmacy for the refill. If patient does not wish to contact the pharmacy document the reason why and proceed with request.) (Agent: If yes, when and what did the pharmacy advise?)  This is the patient's preferred pharmacy:  [id number] - [place], [place] - [address] [place] [address] Phone: [phone] Fax: [phone]   Is this the correct pharmacy for this prescription? Yes If no, delete pharmacy and type the correct one.   Has the prescription been filled recently? Yes  Is the patient out of the medication? Yes  Has the patient been seen for an appointment in the last year OR does the patient have an upcoming appointment? Yes  Can we respond through MyChart? Yes  Agent: Please be advised that Rx refills may take up to 3 business days. We ask that you follow-up with your pharmacy.

## 2024-01-09 NOTE — Telephone Encounter (Signed)
 Sending note to Dr Watt

## 2024-01-09 NOTE — Telephone Encounter (Signed)
 The patient has crystal proven gout.  I suspect that he will need some acute gout medications intermittently indefinitely.  I am going to stand his indomethacin  refill to his primary care provider who will manage this long-term.

## 2024-01-10 MED ORDER — COLCHICINE 0.6 MG PO TABS: 0.6000 mg | ORAL_TABLET | Freq: Two times a day (BID) | ORAL | 2 refills | Status: AC

## 2024-01-10 NOTE — Telephone Encounter (Signed)
 Got a request for zoloft  but looks like he has not been taking it. Can we calrify

## 2024-01-11 NOTE — Telephone Encounter (Signed)
 Lvm asking pt to call back. Need to relay Matt's message and get answer to his question.

## 2024-01-30 ENCOUNTER — Ambulatory Visit (INDEPENDENT_AMBULATORY_CARE_PROVIDER_SITE_OTHER): Admitting: Family

## 2024-01-30 ENCOUNTER — Encounter: Payer: Self-pay | Admitting: Family

## 2024-01-30 VITALS — BP 134/80 | HR 68 | Temp 98.0°F | Ht 71.0 in | Wt 199.6 lb

## 2024-01-30 DIAGNOSIS — M7121 Synovial cyst of popliteal space [Baker], right knee: Secondary | ICD-10-CM

## 2024-01-30 MED ORDER — PREDNISONE 10 MG (21) PO TBPK: ORAL_TABLET | ORAL | 0 refills | Status: AC

## 2024-01-30 NOTE — Progress Notes (Signed)
 Established Patient Office Visit  Subjective:      CC:  Chief Complaint  Patient presents with   Acute Visit    R knee pain    HPI: Barajas Barajas. is a 58 y.o. male presenting on 01/30/2024 for Acute Visit (R knee pain) .  Discussed the use of AI scribe software for clinical note transcription with the patient, who gave verbal consent to proceed.  History of Present Illness Barajas Barajas. Kimberlee is a 58 year old male with a history of gout who presents with right knee pain and limited range of motion.  He experienced a sudden increase in right knee pain and limited range of motion since yesterday after kicking a ball. The pain is located in the back of the knee, particularly when trying to straighten it, and he feels there is fluid accumulation. He has a history of similar episodes where fluid was drained from the knee, and recalls a previous incident where a needle malfunctioned during the procedure.  He has a history of gout, which occurred more frequently when consuming alcohol. He has not consumed alcohol since April. He was previously treated with steroids for gout, which coincided with his alcohol consumption.  He took ibuprofen  once at 3 AM to alleviate the pain, which was severe enough to make walking difficult. He has not taken any other medications for the knee pain since then.  No recent trauma to the knee. The current episode is not as severe as previous ones, with less fluid accumulation. He mentions a past issue with his left knee, for which he had an x-ray, but the current problem is with his right knee.         Social history:  Relevant past medical, surgical, family and social history reviewed and updated as indicated. Interim medical history since our last visit reviewed.  Allergies and medications reviewed and updated.  DATA REVIEWED: CHART IN EPIC     ROS: Negative unless specifically indicated above in HPI.    Current Outpatient  Medications:    lisinopril -hydrochlorothiazide  (ZESTORETIC ) 10-12.5 MG tablet, TAKE 1 TABLET BY MOUTH DAILY. NEED OFFICE VISIT FOR FURTHER REFILLS, Disp: 90 tablet, Rfl: 0   Omega-3 Fatty Acids (FISH OIL) 1000 MG CAPS, Take by mouth. 2 daily, Disp: , Rfl:    omeprazole  (PRILOSEC) 40 MG capsule, Take 1 capsule (40 mg total) by mouth daily., Disp: 90 capsule, Rfl: 1   predniSONE  (STERAPRED UNI-PAK 21 TAB) 10 MG (21) TBPK tablet, Take as directed, Disp: 1 each, Rfl: 0   colchicine  0.6 MG tablet, Take 1 tablet (0.6 mg total) by mouth 2 (two) times daily. (Patient not taking: Reported on 01/30/2024), Disp: 40 tablet, Rfl: 2   indomethacin  (INDOCIN ) 50 MG capsule, TAKE 1 CAPSULE BY MOUTH 3 TIMES DAILY AS NEEDED. (Patient not taking: Reported on 01/30/2024), Disp: 50 capsule, Rfl: 0        Objective:        BP 134/80 (BP Location: Left Arm, Patient Position: Sitting, Cuff Size: Normal)   Pulse 68   Temp 98 F (36.7 C) (Temporal)   Ht 5' 11 (1.803 m)   Wt 199 lb 9.6 oz (90.5 kg)   SpO2 97%   BMI 27.84 kg/m   Physical Exam MEASUREMENTS: Height- 4'11, Weight- 97. MUSCULOSKELETAL: Tenderness and fluid accumulation in the posterior right knee. No erythema or increased warmth in the right knee.  Wt Readings from Last 3 Encounters:  01/30/24 199 lb 9.6 oz (  90.5 kg)  12/08/23 196 lb (88.9 kg)  12/07/23 199 lb 15.3 oz (90.7 kg)    Physical Exam Vitals reviewed.  Musculoskeletal:     Right knee: No swelling, erythema or bony tenderness. Decreased range of motion (at 90 degree angle with pain posterior knee).     Comments: Posterior knee cyst with slight tenderness on palpation           Results   Assessment & Plan:   Assessment and Plan Assessment & Plan Right knee Baker's cyst Acute onset of pain and limited range of motion in the right knee, likely due to a Baker's cyst. The cyst is causing discomfort, especially when pressure is applied or when standing. There is some fluid  accumulation, but not as much as previous episodes. Differential diagnosis includes gout, but current presentation is more consistent with a Baker's cyst. - Prescribe a prednisone  burst to reduce inflammation and swelling. - Advise against taking ibuprofen  or naproxen  while on prednisone ; acetaminophen  is permissible. - Recommend rest, elevation, and ice application to the affected knee. - Instruct to avoid activities that may aggravate the knee for the next two days. - If symptoms do not improve or worsen, schedule an appointment with Doctor Ubaldo for potential drainage of the cyst.  Gout Current symptoms are not consistent with a gout flare, as there is no redness or heat in the knee.  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 Previous alcohol consumption was associated with gout flares.        Return for f/u PCP if no improvement in symptoms.     Patrick Patrick, MSN, APRN, FNP-C [facility]

## 2024-03-13 ENCOUNTER — Ambulatory Visit: Admitting: Nurse Practitioner

## 2024-03-25 ENCOUNTER — Ambulatory Visit
Admission: EM | Admit: 2024-03-25 | Discharge: 2024-03-25 | Disposition: A | Discharge status: Home / Self Care | Attending: Nurse Practitioner | Admitting: Nurse Practitioner

## 2024-03-25 DIAGNOSIS — R109 Unspecified abdominal pain: Secondary | ICD-10-CM

## 2024-03-25 MED ORDER — KETOROLAC TROMETHAMINE 30 MG/ML IJ SOLN
30.0000 mg | Freq: Once | INTRAMUSCULAR | Status: AC
  Administered 2024-03-25: 30 mg via INTRAMUSCULAR

## 2024-03-25 NOTE — Discharge Instructions (Addendum)
 You were seen today for abdominal pain.  We gave you an anti-inflammatory injection to help with the pain.  You can continue Tylenol  at home every 6 hours as needed for pain.  Avoid NSAIDs (ibuprofen , Aleve , naproxen , Motrin ) for 48 hours since we gave you the injection today.  Recommend pushing clear liquids for the next 2 days, avoid solid food.  If symptoms worsen in any way, please seek care in the emergency room.

## 2024-03-25 NOTE — ED Provider Notes (Signed)
 [facility]    CSN: [id number] Arrival date & time: 03/25/24  0802      History   Chief Complaint Chief Complaint  Patient presents with   Abdominal Pain    HPI Patrick Barajas. is a 58 y.o. male.   Patient presents today with 2-day history of left upper quadrant abdominal pain.  He currently rates the pain as a 7 out of 10 and is a dull ache.  Yesterday, it felt like he had a warm blanket over his abdomen.  He denies fever, body aches, nausea, vomiting, changes in bowel movement, blood in the stool.  Denies urinary symptoms.  Patient reports history of similar pain 1 week ago that fully resolved and was associated with drinking alcohol.  Reports he is an alcoholic, has been in remission for 6 months.  Has taken tramadol for the pain which made him feel dizzy and did not really help with the pain.  Reports that Tylenol  helps a little bit with the pain.  He has been drinking a lot of Gatorade and a little bit of mashed potatoes yesterday without vomiting.  Abdominal pain is worse when abdomen is touched, not associated with eating.    Patient acknowledges elevated blood pressure today.  Reports yesterday my blood pressure was spot on all day.    Past Medical History:  Diagnosis Date   Anxiety    GERD (gastroesophageal reflux disease)    Hypertension     Patient Active Problem List   Diagnosis Date Noted   Left ear pain 09/02/2023   Tension headache 09/02/2023   Irritability 09/02/2023   Elevated LFTs 09/02/2023   Dysfunction of left eustachian tube 09/02/2023   Preventative health care 12/17/2021   Alcohol withdrawal (HCC) 10/01/2021   Hyponatremia 10/01/2021   Abdominal pain 10/01/2021   Gout 06/03/2020   Tobacco use 09/01/2019   Anxiety 08/31/2019   GERD (gastroesophageal reflux disease) 08/31/2019   HTN (hypertension) 08/31/2019   Hypertriglyceridemia 08/31/2019    Past Surgical History:  Procedure Laterality Date   COLONOSCOPY WITH PROPOFOL  N/A  10/20/2018   Procedure: COLONOSCOPY WITH PROPOFOL ;  Surgeon: Gaylyn Gladis PENNER, MD;  Location: [facility] ENDOSCOPY;  Service: Endoscopy;  Laterality: N/A;   ESOPHAGOGASTRODUODENOSCOPY (EGD) WITH PROPOFOL  N/A 10/20/2018   Procedure: ESOPHAGOGASTRODUODENOSCOPY (EGD) WITH PROPOFOL ;  Surgeon: Gaylyn Gladis PENNER, MD;  Location: [facility] ENDOSCOPY;  Service: Endoscopy;  Laterality: N/A;       Home Medications    Prior to Admission medications   Medication Sig Start Date End Date Taking? Authorizing Provider  lisinopril -hydrochlorothiazide  (ZESTORETIC ) 10-12.5 MG tablet TAKE 1 TABLET BY MOUTH DAILY. NEED OFFICE VISIT FOR FURTHER REFILLS 01/10/24  Yes Wendee Lynwood HERO, NP  omeprazole  (PRILOSEC) 40 MG capsule Take 1 capsule (40 mg total) by mouth daily. 02/17/23  Yes Wendee Lynwood HERO, NP  colchicine  0.6 MG tablet Take 1 tablet (0.6 mg total) by mouth 2 (two) times daily. Patient not taking: Reported on 01/30/2024 01/10/24   Wendee Lynwood HERO, NP  indomethacin  (INDOCIN ) 50 MG capsule TAKE 1 CAPSULE BY MOUTH 3 TIMES DAILY AS NEEDED. Patient not taking: Reported on 01/30/2024 01/10/24   Wendee Lynwood HERO, NP  Omega-3 Fatty Acids (FISH OIL) 1000 MG CAPS Take by mouth. 2 daily    [provider]  predniSONE  (STERAPRED UNI-PAK 21 TAB) 10 MG (21) TBPK tablet Take as directed 01/30/24   Corwin Antu, FNP    Family History Family History  Problem Relation Age of Onset   Breast  cancer Mother    Stroke Father    Heart attack Father    Healthy Brother    Lung cancer Maternal Aunt    Liver cancer Maternal Aunt    Pancreatic cancer Maternal Uncle    Bone cancer Maternal Grandmother     Social History Social History   Tobacco Use   Smoking status: Never   Smokeless tobacco: Current    Types: Snuff  Vaping Use   Vaping status: Never Used  Substance Use Topics   Alcohol use: Not Currently    Alcohol/week: 8.0 standard drinks of alcohol    Types: 8 Cans of beer per week    Comment: stopped drinking May 2025    Drug use: No     Allergies   Morphine    Review of Systems Review of Systems Per HPI  Physical Exam Triage Vital Signs ED Triage Vitals [03/25/24 0817]  Encounter Vitals Group     BP      Girls Systolic BP Percentile      Girls Diastolic BP Percentile      Boys Systolic BP Percentile      Boys Diastolic BP Percentile      Pulse      Resp      Temp      Temp src      SpO2      Weight 195 lb (88.5 kg)     Height      Head Circumference      Peak Flow      Pain Score 8     Pain Loc      Pain Education      Exclude from Growth Chart    No data found.  Updated Vital Signs BP (!) 187/80 (BP Location: Right Arm)   Pulse 94   Temp 99.2 F (37.3 C) (Oral)   Resp 18   Wt 195 lb (88.5 kg)   SpO2 96%   BMI 27.20 kg/m   Visual Acuity Right Eye Distance:   Left Eye Distance:   Bilateral Distance:    Right Eye Near:   Left Eye Near:    Bilateral Near:     Physical Exam Vitals and nursing note reviewed.  Constitutional:      General: He is not in acute distress.    Appearance: Normal appearance. He is not toxic-appearing.  HENT:     Head: Normocephalic and atraumatic.     Mouth/Throat:     Mouth: Mucous membranes are moist.     Pharynx: Oropharynx is clear. No oropharyngeal exudate or posterior oropharyngeal erythema.  Cardiovascular:     Rate and Rhythm: Normal rate and regular rhythm.  Pulmonary:     Effort: Pulmonary effort is normal. No respiratory distress.     Breath sounds: Normal breath sounds. No wheezing, rhonchi or rales.  Abdominal:     General: Abdomen is flat. Bowel sounds are normal. There is no distension.     Palpations: Abdomen is soft.     Tenderness: There is abdominal tenderness in the epigastric area. There is no right CVA tenderness, left CVA tenderness, guarding or rebound. Negative signs include Murphy's sign, Rovsing's sign and McBurney's sign.      Comments: Pain to area marked with palpation  Musculoskeletal:     Cervical  back: Normal range of motion.  Lymphadenopathy:     Cervical: No cervical adenopathy.  Skin:    General: Skin is warm and dry.     Capillary Refill: Capillary  refill takes less than 2 seconds.     Coloration: Skin is not jaundiced or pale.     Findings: No erythema.  Neurological:     Mental Status: He is alert and oriented to person, place, and time.     Motor: No weakness.     Gait: Gait normal.  Psychiatric:        Behavior: Behavior is cooperative.      UC Treatments / Results  Labs (all labs ordered are listed, but only abnormal results are displayed) Labs Reviewed - No data to display  EKG   Radiology No results found.  Procedures Procedures (including critical care time)  Medications Ordered in UC Medications  ketorolac  (TORADOL ) 30 MG/ML injection 30 mg (30 mg Intramuscular Given 03/25/24 0836)    Initial Impression / Assessment and Plan / UC Course  I have reviewed the triage vital signs and the nursing notes.  Pertinent labs & imaging results that were available during my care of the patient were reviewed by me and considered in my medical decision making (see chart for details).   Patient is hypertensive in triage, otherwise vital signs are stable.  On exam, he has tenderness over the left epigastric area.  There is no guarding to palpation of the abdomen, no rebound tenderness, no red flags.  Unclear etiology of abdominal pain, differentials are pancreatitis, gastritis.  Without vomiting, blood in the stool, low suspicion for emergent cause.  Will treat pain with Toradol  30 mg IM in urgent care today, Tylenol  at home as needed.  Recommended pushing clear liquids for the next 2 days.  Strict ER precautions were discussed with patient including if he develops fever, nausea/vomiting, or severe abdominal pain, he should seek care in the emergency room.  The patient was given the opportunity to ask questions.  All questions answered to their satisfaction.  The  patient is in agreement to this plan.   Final Clinical Impressions(s) / UC Diagnoses   Final diagnoses:  Left sided abdominal pain     Discharge Instructions      You were seen today for abdominal pain.  We gave you an anti-inflammatory injection to help with the pain.  You can continue Tylenol  at home every 6 hours as needed for pain.  Avoid NSAIDs (ibuprofen , Aleve , naproxen , Motrin ) for 48 hours since we gave you the injection today.  Recommend pushing clear liquids for the next 2 days, avoid solid food.  If symptoms worsen in any way, please seek care in the emergency room.     ED Prescriptions   None    PDMP not reviewed this encounter.   Chandra Harlene LABOR, NP 03/25/24 [phone]

## 2024-03-25 NOTE — ED Triage Notes (Addendum)
 Patient states that he's having left abdominal pain under his rib. Patient states that this has been going on 2 days. Patient states that the pain feels sharp, but felt  like he had a heated blanket on. Patient states that he has pancreatitis a few years ago from drinking but stopped. Patient states that he did drink a few beers a few days ago.

## 2024-03-26 ENCOUNTER — Other Ambulatory Visit: Payer: Self-pay

## 2024-03-26 ENCOUNTER — Emergency Department
Admission: EM | Admit: 2024-03-26 | Discharge: 2024-03-26 | Disposition: A | Discharge status: Home / Self Care | Attending: Emergency Medicine | Admitting: Emergency Medicine

## 2024-03-26 ENCOUNTER — Encounter: Payer: Self-pay | Admitting: *Deleted

## 2024-03-26 DIAGNOSIS — R748 Abnormal levels of other serum enzymes: Secondary | ICD-10-CM | POA: Diagnosis not present

## 2024-03-26 DIAGNOSIS — R1012 Left upper quadrant pain: Secondary | ICD-10-CM

## 2024-03-26 DIAGNOSIS — R1011 Right upper quadrant pain: Secondary | ICD-10-CM | POA: Diagnosis not present

## 2024-03-26 LAB — COMPREHENSIVE METABOLIC PANEL WITH GFR
ALT: 20 U/L (ref 0–44)
AST: 28 U/L (ref 15–41)
Albumin: 4.4 g/dL (ref 3.5–5.0)
Alkaline Phosphatase: 76 U/L (ref 38–126)
Anion gap: 11 (ref 5–15)
BUN: 8 mg/dL (ref 6–20)
CO2: 27 mmol/L (ref 22–32)
Calcium: 9.5 mg/dL (ref 8.9–10.3)
Chloride: 97 mmol/L — ABNORMAL LOW (ref 98–111)
Creatinine, Ser: 0.9 mg/dL (ref 0.61–1.24)
GFR, Estimated: >60 mL/min (ref >60)
Glucose, Bld: 113 mg/dL — ABNORMAL HIGH (ref 70–99)
Potassium: 3.9 mmol/L (ref 3.5–5.1)
Sodium: 135 mmol/L (ref 135–145)
Total Bilirubin: 0.7 mg/dL (ref 0.0–1.2)
Total Protein: 7.7 g/dL (ref 6.5–8.1)

## 2024-03-26 LAB — CBC
HCT: 41.6 % (ref 39.0–52.0)
Hemoglobin: 14.8 g/dL (ref 13.0–17.0)
MCH: 33.6 pg (ref 26.0–34.0)
MCHC: 35.6 g/dL (ref 30.0–36.0)
MCV: 94.5 fL (ref 80.0–100.0)
Platelets: 470 K/uL — ABNORMAL HIGH (ref 150–400)
RBC: 4.4 MIL/uL (ref 4.22–5.81)
RDW: 13.2 % (ref 11.5–15.5)
WBC: 6.1 K/uL (ref 4.0–10.5)
nRBC: 0 % (ref 0.0–0.2)

## 2024-03-26 LAB — LIPASE, BLOOD: Lipase: 58 U/L — ABNORMAL HIGH (ref 11–51)

## 2024-03-26 MED ORDER — SUCRALFATE 1 G PO TABS
1.0000 g | ORAL_TABLET | Freq: Once | ORAL | Status: AC
  Administered 2024-03-26: 1 g via ORAL
  Filled 2024-03-26: qty 1

## 2024-03-26 MED ORDER — PANTOPRAZOLE SODIUM 40 MG PO TBEC
40.0000 mg | DELAYED_RELEASE_TABLET | Freq: Once | ORAL | Status: AC
  Administered 2024-03-26: 40 mg via ORAL
  Filled 2024-03-26: qty 1

## 2024-03-26 MED ORDER — ALUM & MAG HYDROXIDE-SIMETH 200-200-20 MG/5ML PO SUSP
30.0000 mL | Freq: Once | ORAL | Status: AC
  Administered 2024-03-26: 30 mL via ORAL
  Filled 2024-03-26: qty 30

## 2024-03-26 MED ORDER — ONDANSETRON 4 MG PO TBDP: ORAL_TABLET | ORAL | 0 refills | Status: AC

## 2024-03-26 MED ORDER — HYDROCODONE-ACETAMINOPHEN 5-325 MG PO TABS: 2.0000 | ORAL_TABLET | Freq: Four times a day (QID) | ORAL | 0 refills | Status: AC | PRN

## 2024-03-26 NOTE — ED Triage Notes (Signed)
 Pt reports left sharp sided abdominal pain for about 3 days. Hx of pancreatitis. Last drink was a week ago Friday.

## 2024-03-26 NOTE — Discharge Instructions (Signed)
 As we discussed, we think that your discomfort is likely the result of a mild case of pancreatitis caused by your brief relapse with alcohol.  Please avoid all alcohol and spicy greasy foods until you feel completely better.  Please read to the included information about pancreatitis and about the pancreatitis eating plan.  You can use over-the-counter pain medicine as needed for pain control, and Take Norco as prescribed for severe pain. Do not drink alcohol, drive or participate in any other potentially dangerous activities while taking this medication as it may make you sleepy. Do not take this medication with any other sedating medications, either prescription or over-the-counter. If you were prescribed Percocet or Vicodin, do not take these with acetaminophen  (Tylenol ) as it is already contained within these medications.   This medication is an opiate (or narcotic) pain medication and can be habit forming.  Use it as little as possible to achieve adequate pain control.  Do not use or use it with extreme caution if you have a history of opiate abuse or dependence.  If you are on a pain contract with your primary care doctor or a pain specialist, be sure to let them know you were prescribed this medication today from the [facility].  This medication is intended for your use only - do not give any to anyone else and keep it in a secure place where nobody else, especially children, have access to it.  It will also cause or worsen constipation, so you may want to consider taking an over-the-counter stool softener while you are taking this medication.

## 2024-03-26 NOTE — ED Provider Notes (Signed)
 [facility] Provider Note    Event Date/Time   First MD Initiated Contact with Patient 03/26/24 [phone]     (approximate)   History   Abdominal Pain   HPI Granville Whitefield. is a 58 y.o. male who reports a history of alcohol dependence who had been sober for an extended period of time but had a little bit of alcohol about a week ago.  He presents tonight for persistent pain in the left upper quadrant of his abdomen for the last 3 or so days.  It is an achy dull pain that will not go away.  He has had pancreatitis in the past but said that this is milder.  He just drank a little bit and does not think it should have been enough to make him sick again.  He has not had any nausea or vomiting.  The pain is substantial but does not seem to be worse with eating although he has not had much of an appetite.  No chest pain or shortness of breath.  His last drink was several days ago.     Physical Exam   Triage Vital Signs: ED Triage Vitals  Encounter Vitals Group     BP 03/26/24 0512 (!) 142/94     Girls Systolic BP Percentile --      Girls Diastolic BP Percentile --      Boys Systolic BP Percentile --      Boys Diastolic BP Percentile --      Pulse Rate 03/26/24 0512 64     Resp 03/26/24 0512 16     Temp 03/26/24 0512 98.7 F (37.1 C)     Temp Source 03/26/24 0512 Oral     SpO2 03/26/24 0512 100 %     Weight --      Height --      Head Circumference --      Peak Flow --      Pain Score 03/26/24 0510 2     Pain Loc --      Pain Education --      Exclude from Growth Chart --     Most recent vital signs: Vitals:   03/26/24 0512  BP: (!) 142/94  Pulse: 64  Resp: 16  Temp: 98.7 F (37.1 C)  SpO2: 100%    General: Awake, no distress.  Generally well-appearing. CV:  Good peripheral perfusion.  Resp:  Normal effort. Speaking easily and comfortably, no accessory muscle usage nor intercostal retractions.   Abd:  No distention.  Soft.  Mildly tender to  palpation just left of center in the upper abdomen.  No specific epigastric nor right upper quadrant tenderness.  No rebound or guarding.   ED Results / Procedures / Treatments   Labs (all labs ordered are listed, but only abnormal results are displayed) Labs Reviewed  LIPASE, BLOOD - Abnormal; Notable for the following components:      Result Value   Lipase 58 (*)    All other components within normal limits  COMPREHENSIVE METABOLIC PANEL WITH GFR - Abnormal; Notable for the following components:   Chloride 97 (*)    Glucose, Bld 113 (*)    All other components within normal limits  CBC - Abnormal; Notable for the following components:   Platelets 470 (*)    All other components within normal limits     RADIOLOGY See ED course for details   PROCEDURES:  Critical Care performed: No  Procedures  IMPRESSION / MDM / ASSESSMENT AND PLAN / ED COURSE  I reviewed the triage vital signs and the nursing notes.                              Differential diagnosis includes, but is not limited to, pancreatitis, gallbladder disease, acid reflux, ulcer.  Patient's presentation is most consistent with acute presentation with potential threat to life or bodily function.  Labs/studies ordered: CBC, CMP, lipase  Interventions/Medications given:  Medications  sucralfate  (CARAFATE ) tablet 1 g (1 g Oral Given 03/26/24 0620)  pantoprazole  (PROTONIX ) EC tablet 40 mg (40 mg Oral Given 03/26/24 0620)  alum & mag hydroxide-simeth (MAALOX/MYLANTA) 200-200-20 MG/5ML suspension 30 mL (30 mLs Oral Given 03/26/24 0620)    (Note:  hospital course my include additional interventions and/or labs/studies not listed above.)   Patient's vitals are normal and physical exam is reassuring with only mild tenderness to the palpation of the left upper quadrant.  Mild pancreatitis is certainly possible.  I will consider CT of the abdomen and pelvis particularly if the lipase is elevated, but given mild  symptoms and no vomiting, he may be appropriate for outpatient management of mild pancreatitis.   Clinical Course as of 03/26/24 9347  [facility] Mar 26, 2024  9353 Patient's lab generally reassuring except for a slightly elevated lipase at 58.  I will talk to the patient about conservative management at home, bland diet, etc. [CF]  [phone] Patient is comfortable with the plan for outpatient management but I gave my usual return precautions for him to come back if he gets worse. [CF]    Clinical Course User Index [CF] Gordan Huxley, MD     FINAL CLINICAL IMPRESSION(S) / ED DIAGNOSES   Final diagnoses:  LUQ abdominal pain  Elevated lipase     Rx / DC Orders   ED Discharge Orders          Ordered    HYDROcodone-acetaminophen  (NORCO/VICODIN) 5-325 MG tablet  Every 6 hours PRN        03/26/24 0652    ondansetron  (ZOFRAN -ODT) 4 MG disintegrating tablet        03/26/24 9347             Note:  This document was prepared using Dragon voice recognition software and may include unintentional dictation errors.   Gordan Huxley, MD 03/26/24 [phone]

## 2024-04-22 ENCOUNTER — Other Ambulatory Visit: Payer: Self-pay

## 2024-04-22 ENCOUNTER — Encounter: Payer: Self-pay | Admitting: Emergency Medicine

## 2024-04-22 ENCOUNTER — Emergency Department
Admission: EM | Admit: 2024-04-22 | Discharge: 2024-04-22 | Disposition: A | Discharge status: Home / Self Care | Payer: Self-pay | Attending: Emergency Medicine | Admitting: Emergency Medicine

## 2024-04-22 ENCOUNTER — Emergency Department: Payer: Self-pay

## 2024-04-22 DIAGNOSIS — R748 Abnormal levels of other serum enzymes: Secondary | ICD-10-CM | POA: Insufficient documentation

## 2024-04-22 DIAGNOSIS — D72829 Elevated white blood cell count, unspecified: Secondary | ICD-10-CM | POA: Insufficient documentation

## 2024-04-22 DIAGNOSIS — K2900 Acute gastritis without bleeding: Secondary | ICD-10-CM | POA: Insufficient documentation

## 2024-04-22 LAB — URINALYSIS, ROUTINE W REFLEX MICROSCOPIC
Bilirubin Urine: NEGATIVE
Glucose, UA: NEGATIVE mg/dL
Hgb urine dipstick: NEGATIVE
Ketones, ur: NEGATIVE mg/dL
Leukocytes,Ua: NEGATIVE
Nitrite: NEGATIVE
Protein, ur: NEGATIVE mg/dL
Specific Gravity, Urine: 1.002 — ABNORMAL LOW (ref 1.005–1.030)
pH: 7 (ref 5.0–8.0)

## 2024-04-22 LAB — COMPREHENSIVE METABOLIC PANEL WITH GFR
ALT: 16 U/L (ref 0–44)
AST: 24 U/L (ref 15–41)
Albumin: 4.7 g/dL (ref 3.5–5.0)
Alkaline Phosphatase: 74 U/L (ref 38–126)
Anion gap: 13 (ref 5–15)
BUN: 8 mg/dL (ref 6–20)
CO2: 26 mmol/L (ref 22–32)
Calcium: 9.9 mg/dL (ref 8.9–10.3)
Chloride: 93 mmol/L — ABNORMAL LOW (ref 98–111)
Creatinine, Ser: 0.86 mg/dL (ref 0.61–1.24)
GFR, Estimated: >60 mL/min
Glucose, Bld: 110 mg/dL — ABNORMAL HIGH (ref 70–99)
Potassium: 3.6 mmol/L (ref 3.5–5.1)
Sodium: 132 mmol/L — ABNORMAL LOW (ref 135–145)
Total Bilirubin: 1.4 mg/dL — ABNORMAL HIGH (ref 0.0–1.2)
Total Protein: 8.2 g/dL — ABNORMAL HIGH (ref 6.5–8.1)

## 2024-04-22 LAB — CBC
HCT: 40.3 % (ref 39.0–52.0)
Hemoglobin: 14.7 g/dL (ref 13.0–17.0)
MCH: 33.3 pg (ref 26.0–34.0)
MCHC: 36.5 g/dL — ABNORMAL HIGH (ref 30.0–36.0)
MCV: 91.4 fL (ref 80.0–100.0)
Platelets: 449 K/uL — ABNORMAL HIGH (ref 150–400)
RBC: 4.41 MIL/uL (ref 4.22–5.81)
RDW: 12.5 % (ref 11.5–15.5)
WBC: 13 K/uL — ABNORMAL HIGH (ref 4.0–10.5)
nRBC: 0 % (ref 0.0–0.2)

## 2024-04-22 LAB — LIPASE, BLOOD: Lipase: 56 U/L — ABNORMAL HIGH (ref 11–51)

## 2024-04-22 MED ORDER — SUCRALFATE 1 G PO TABS: 1.0000 g | ORAL_TABLET | Freq: Four times a day (QID) | ORAL | 0 refills | Status: AC | PRN

## 2024-04-22 MED ORDER — SUCRALFATE 1 G PO TABS
1.0000 g | ORAL_TABLET | Freq: Once | ORAL | Status: AC
  Administered 2024-04-22: 1 g via ORAL
  Filled 2024-04-22: qty 1

## 2024-04-22 MED ORDER — IOHEXOL 300 MG/ML  SOLN
100.0000 mL | Freq: Once | INTRAMUSCULAR | Status: AC | PRN
  Administered 2024-04-22: 100 mL via INTRAVENOUS

## 2024-04-22 NOTE — ED Provider Notes (Signed)
 "  [facility] Provider Note    Event Date/Time   First MD Initiated Contact with Patient 04/22/24 1730     (approximate)   History   Abdominal Pain   HPI  Patrick Matera. is a 58 y.o. male with a history of alcohol dependence and pancreatitis who presents with left upper quadrant abdominal pain over the last couple weeks.  The patient states that initially he had been sober since the spring.  He started drinking heavily after Halloween and subsequent ended up with abdominal pain.  He was seen in the ED at the beginning of the month and diagnosed with pancreatitis.  He states that he has not had any further alcohol since that time.  However a couple weeks ago he injured his right knee.  He started taking tramadol and ibuprofen  (stating he was only taking 400 mg ibuprofen  twice a day most days), and then subsequently started develop left upper quadrant pain which sometimes migrates to different parts of his belly or his chest.  He denies any associated nausea or vomiting.  He is able to eat although his appetite especially the last few days has decreased.  He has had some diarrhea over the last couple days as well.  I reviewed the past medical records.  The patient was seen in the ED on 12/1 with left upper quadrant abdominal pain.  Lipase was minimally elevated.  He was discharged home.  Previously his most recent outpatient encounter was with primary care on 10/6 for evaluation of knee pain.   Physical Exam   Triage Vital Signs: ED Triage Vitals  Encounter Vitals Group     BP 04/22/24 1449 (!) 135/105     Girls Systolic BP Percentile --      Girls Diastolic BP Percentile --      Boys Systolic BP Percentile --      Boys Diastolic BP Percentile --      Pulse Rate 04/22/24 1449 100     Resp 04/22/24 1449 16     Temp 04/22/24 1449 98.8 F (37.1 C)     Temp Source 04/22/24 1449 Oral     SpO2 04/22/24 1449 97 %     Weight --      Height --      Head  Circumference --      Peak Flow --      Pain Score 04/22/24 1451 5     Pain Loc --      Pain Education --      Exclude from Growth Chart --     Most recent vital signs: Vitals:   04/22/24 1449 04/22/24 1858  BP: (!) 135/105 128/87  Pulse: 100 71  Resp: 16 16  Temp: 98.8 F (37.1 C) 98.4 F (36.9 C)  SpO2: 97% 97%    General: Awake, no distress.  CV:  Good peripheral perfusion.  Resp:  Normal effort.  Abd:  Soft with mild left upper quadrant tenderness.  No distention.  Other:  No jaundice or scleral icterus.   ED Results / Procedures / Treatments   Labs (all labs ordered are listed, but only abnormal results are displayed) Labs Reviewed  LIPASE, BLOOD - Abnormal; Notable for the following components:      Result Value   Lipase 56 (*)    All other components within normal limits  COMPREHENSIVE METABOLIC PANEL WITH GFR - Abnormal; Notable for the following components:   Sodium 132 (*)  Chloride 93 (*)    Glucose, Bld 110 (*)    Total Protein 8.2 (*)    Total Bilirubin 1.4 (*)    All other components within normal limits  CBC - Abnormal; Notable for the following components:   WBC 13.0 (*)    MCHC 36.5 (*)    Platelets 449 (*)    All other components within normal limits  URINALYSIS, ROUTINE W REFLEX MICROSCOPIC - Abnormal; Notable for the following components:   Color, Urine STRAW (*)    APPearance CLEAR (*)    Specific Gravity, Urine 1.002 (*)    All other components within normal limits     EKG     RADIOLOGY  XR R knee: I independently viewed and interpreted the images; there is no acute fracture.  Radiology report indicates the following:  IMPRESSION:  1. No fracture or dislocation.  2. 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  3. Small joint effusion.   CT abdomen/pelvis: I independently viewed and interpreted the images; there are no dilated bowel loops or any free air or free fluid.  Radiology report  indicates:  IMPRESSION:  1. Focal small bowel wall thickening with surrounding inflammatory  changes in the mid left abdomen, suggesting focal enteritis.  2. Hepatic steatosis.  3. Diverticulosis without diverticulitis.  4. Aortic atherosclerosis.    PROCEDURES:  Critical Care performed: No  Procedures   MEDICATIONS ORDERED IN ED: Medications  sucralfate  (CARAFATE ) tablet 1 g (has no administration in time range)  iohexol  (OMNIPAQUE ) 300 MG/ML solution 100 mL (100 mLs Intravenous Contrast Given 04/22/24 1847)     IMPRESSION / MDM / ASSESSMENT AND PLAN / ED COURSE  I reviewed the triage vital signs and the nursing notes.  58 year old male with PMH as noted above including recent likely alcohol induced mild pancreatitis presents with recurrent left upper quadrant abdominal pain after recently taking tramadol and ibuprofen  for a knee injury.  On exam he is overall well-appearing.  Vital signs are normal.  His abdomen soft but he does have some left upper quadrant tenderness.  Differential diagnosis includes, but is not limited to, recurrent pancreatitis, gastritis, PUD, gastroenteritis, colitis, diverticulitis.  Initial lab workup is overall reassuring.  CMP shows no acute findings.  CBC does show leukocytosis which is new.  Lipase is minimally elevated similar to his last ED visit.  Especially given the elevated WBC count, as well as the recurrent episodes of similar pain with a minimally elevated lipase, I think that would be helpful to obtain a CT for further evaluation.  Patient's presentation is most consistent with acute complicated illness / injury requiring diagnostic workup.  ----------------------------------------- 7:58 PM on 04/22/2024 -----------------------------------------  CT shows an area of focal enteritis.  This is consistent with my initial suspicion that the patient may be having gastroenteritis on top of existing subacute gastritis.  I counseled the  patient on the results of the workup and plan of care.  I recommended that he avoid NSAIDs and alcohol, and have given him dietary recommendations for gastritis.  He is already on omeprazole  20 mg daily, and I have instructed him to go up to 40 mg.  I have also prescribed Carafate  for breakthrough episodes of the upper abdominal pain.  I answered all the patient's questions.  I gave strict return precautions, and he expresses understanding.  He is stable for discharge at this time.   FINAL CLINICAL IMPRESSION(S) / ED DIAGNOSES   Final diagnoses:  Acute gastritis without hemorrhage, unspecified  gastritis type     Rx / DC Orders   ED Discharge Orders          Ordered    sucralfate  (CARAFATE ) 1 g tablet  4 times daily PRN        04/22/24 1956             Note:  This document was prepared using Dragon voice recognition software and may include unintentional dictation errors.    Jacolyn Pae, MD 04/22/24 2000  "

## 2024-04-22 NOTE — Discharge Instructions (Addendum)
 You likely have gastritis, which is inflammation in the stomach lining.  We suspect you also might be having an intestinal stomach virus over the last couple of days causing the worsening symptoms especially the diarrhea.  We have attached instructions including some dietary instructions to help with the gastritis.  You should continue the omeprazole  that you are already taking, 40 mg daily.  In addition, you may take the Carafate  prescribed today.  This can be taken up to 4 times a day as needed if you are having continued pain especially after eating.  Follow-up with your primary care provider.  Return to the ER for new, worsening, or persistent severe abdominal pain, vomiting, blood in the stool, fever, or any other new or worsening symptoms that concern you.

## 2024-04-22 NOTE — ED Triage Notes (Signed)
 Pt reports he was here about a month ago and told pancreatitis from drinking. Pt started taking tramadol for a fall and started having abd pain again. LUQ constant pain.

## 2024-04-27 ENCOUNTER — Ambulatory Visit: Payer: Self-pay

## 2024-04-27 NOTE — Telephone Encounter (Signed)
 Left detailed message on VM per DPR that we were short a few providers and due to the holiday, we do not have any appointments available. I advised him of the [facility] urgent care that is walk-in and open daily, even on the holidays. Advised that he could go there or call the office to be seen one day next week.

## 2024-04-27 NOTE — Telephone Encounter (Signed)
 noted

## 2024-04-27 NOTE — Telephone Encounter (Signed)
 FYI Only or Action Required?: FYI only for provider: UC advised.  Patient was last seen in primary care on 01/30/2024 by Patrick Antu, FNP.  Called Nurse Triage reporting Knee Pain.  Symptoms began several weeks ago.  Interventions attempted: OTC medications: Aleve , Biofreeze, Rest, hydration, or home remedies, and Ice/heat application.  Symptoms are: stable.  Triage Disposition: See Physician Within 24 Hours  Patient/caregiver understands and will follow disposition?: Yes Reason for Disposition  [1] Very swollen joint AND [2] no fever  Answer Assessment - Initial Assessment Questions Patient reports hitting knee a few weeks ago, seen in ED 12/28, Xray clear. Biofreeze, elevation, Aleve . Patient adamant about being seen today, no appointments in PCP office today, advised UC.   1. LOCATION and RADIATION: Where is the pain located?      Right knee, denies radiation  2. QUALITY: What does the pain feel like?  (e.g., sharp, dull, aching, burning)     Feels like a toothache  3. SEVERITY: How bad is the pain? What does it keep you from doing?   (Scale 1-10; or mild, moderate, severe)     5/10  4. ONSET: When did the pain start? Does it come and go, or is it there all the time?     2 weeks ago  5. RECURRENT: Have you had this pain before? If Yes, ask: When, and what happened then?     Had swelling in R knee a year ago from alcohol / gout  6. SETTING: Has there been any recent work, exercise or other activity that involved that part of the body?      Hit the knee 2 weeks ago  7. AGGRAVATING FACTORS: What makes the knee pain worse? (e.g., walking, climbing stairs, running)     Walking and putting pressure on it  8. ASSOCIATED SYMPTOMS: Is there any swelling or redness of the knee?     Swelling, reports it feeling like jell-o  9. OTHER SYMPTOMS: Do you have any other symptoms? (e.g., calf pain, chest pain, difficulty breathing, fever)      Denies  Protocols used: Knee Pain-A-AH  Copied from CRM #[id number]. Topic: Clinical - Red Word Triage >> Apr 27, 2024  8:08 AM Patrick Barajas wrote: Red Word that prompted transfer to Nurse Triage: knee pain and swelling- worsening

## 2024-05-04 ENCOUNTER — Other Ambulatory Visit: Payer: Self-pay | Admitting: Nurse Practitioner

## 2024-05-04 DIAGNOSIS — I10 Essential (primary) hypertension: Secondary | ICD-10-CM
